# Patient Record
Sex: Female | Born: 1940
Health system: Southern US, Community
[De-identification: ages and names within clinical notes are randomized; demographics above are authoritative.]

## PROBLEM LIST (undated history)

## (undated) DIAGNOSIS — E785 Hyperlipidemia, unspecified: Secondary | ICD-10-CM

## (undated) DIAGNOSIS — R413 Other amnesia: Secondary | ICD-10-CM

## (undated) DIAGNOSIS — E559 Vitamin D deficiency, unspecified: Secondary | ICD-10-CM

## (undated) DIAGNOSIS — R296 Repeated falls: Secondary | ICD-10-CM

## (undated) DIAGNOSIS — T783XXA Angioneurotic edema, initial encounter: Secondary | ICD-10-CM

## (undated) DIAGNOSIS — R251 Tremor, unspecified: Secondary | ICD-10-CM

## (undated) DIAGNOSIS — R001 Bradycardia, unspecified: Secondary | ICD-10-CM

## (undated) DIAGNOSIS — F32A Depression, unspecified: Secondary | ICD-10-CM

## (undated) DIAGNOSIS — I10 Essential (primary) hypertension: Secondary | ICD-10-CM

## (undated) DIAGNOSIS — K649 Unspecified hemorrhoids: Secondary | ICD-10-CM

## (undated) DIAGNOSIS — M8580 Other specified disorders of bone density and structure, unspecified site: Secondary | ICD-10-CM

## (undated) DIAGNOSIS — E039 Hypothyroidism, unspecified: Secondary | ICD-10-CM

## (undated) DIAGNOSIS — K5909 Other constipation: Secondary | ICD-10-CM

## (undated) DIAGNOSIS — F329 Major depressive disorder, single episode, unspecified: Secondary | ICD-10-CM

## (undated) DIAGNOSIS — M542 Cervicalgia: Secondary | ICD-10-CM

## (undated) HISTORY — DX: Hypercalcemia: E83.52

## (undated) HISTORY — DX: Tremor, unspecified: R25.1

## (undated) HISTORY — PX: TUBAL LIGATION: SHX77

## (undated) HISTORY — DX: Hyperlipidemia, unspecified: E78.5

## (undated) HISTORY — DX: Unspecified hemorrhoids: K64.9

## (undated) HISTORY — DX: Depression, unspecified: F32.A

## (undated) HISTORY — DX: Repeated falls: R29.6

## (undated) HISTORY — DX: Bradycardia, unspecified: R00.1

## (undated) HISTORY — DX: Other amnesia: R41.3

## (undated) HISTORY — DX: Essential (primary) hypertension: I10

## (undated) HISTORY — DX: Other specified disorders of bone density and structure, unspecified site: M85.80

## (undated) HISTORY — PX: MINOR HEMORRHOIDECTOMY: SHX6238

## (undated) HISTORY — DX: Hemochromatosis, unspecified: E83.119

## (undated) HISTORY — PX: KNEE SURGERY: SHX244

## (undated) HISTORY — DX: Angioneurotic edema, initial encounter: T78.3XXA

## (undated) HISTORY — DX: Hypothyroidism, unspecified: E03.9

## (undated) HISTORY — DX: Vitamin D deficiency, unspecified: E55.9

## (undated) HISTORY — DX: Hereditary hemochromatosis: E83.110

## (undated) HISTORY — PX: CHOLECYSTECTOMY: SHX55

## (undated) HISTORY — DX: Cervicalgia: M54.2

## (undated) HISTORY — PX: SHOULDER SURGERY: SHX246

## (undated) HISTORY — DX: Major depressive disorder, single episode, unspecified: F32.9

## (undated) HISTORY — DX: Other constipation: K59.09

---

## 1998-09-17 ENCOUNTER — Encounter: Payer: Self-pay | Admitting: *Deleted

## 1998-09-17 ENCOUNTER — Ambulatory Visit (HOSPITAL_COMMUNITY): Admission: RE | Admit: 1998-09-17 | Discharge: 1998-09-18 | Payer: Self-pay | Admitting: *Deleted

## 1998-09-23 ENCOUNTER — Other Ambulatory Visit: Admission: RE | Admit: 1998-09-23 | Discharge: 1998-09-23 | Payer: Self-pay | Admitting: Gynecology

## 1999-09-24 ENCOUNTER — Other Ambulatory Visit: Admission: RE | Admit: 1999-09-24 | Discharge: 1999-09-24 | Payer: Self-pay | Admitting: Gynecology

## 2000-09-26 ENCOUNTER — Other Ambulatory Visit: Admission: RE | Admit: 2000-09-26 | Discharge: 2000-09-26 | Payer: Self-pay | Admitting: Gynecology

## 2001-10-03 ENCOUNTER — Other Ambulatory Visit: Admission: RE | Admit: 2001-10-03 | Discharge: 2001-10-03 | Payer: Self-pay | Admitting: Gynecology

## 2002-10-15 ENCOUNTER — Other Ambulatory Visit: Admission: RE | Admit: 2002-10-15 | Discharge: 2002-10-15 | Payer: Self-pay | Admitting: Gynecology

## 2003-10-21 ENCOUNTER — Other Ambulatory Visit: Admission: RE | Admit: 2003-10-21 | Discharge: 2003-10-21 | Payer: Self-pay | Admitting: Gynecology

## 2004-11-12 ENCOUNTER — Other Ambulatory Visit: Admission: RE | Admit: 2004-11-12 | Discharge: 2004-11-12 | Payer: Self-pay | Admitting: Gynecology

## 2012-02-22 ENCOUNTER — Other Ambulatory Visit: Payer: Self-pay | Admitting: Gynecology

## 2012-02-22 DIAGNOSIS — R928 Other abnormal and inconclusive findings on diagnostic imaging of breast: Secondary | ICD-10-CM

## 2012-02-25 ENCOUNTER — Ambulatory Visit
Admission: RE | Admit: 2012-02-25 | Discharge: 2012-02-25 | Disposition: A | Discharge status: Home / Self Care | Payer: Medicare Other | Source: Ambulatory Visit | Attending: Gynecology | Admitting: Gynecology

## 2012-02-25 DIAGNOSIS — R928 Other abnormal and inconclusive findings on diagnostic imaging of breast: Secondary | ICD-10-CM

## 2013-07-20 ENCOUNTER — Encounter: Payer: Self-pay | Admitting: Cardiovascular Disease

## 2013-07-20 ENCOUNTER — Ambulatory Visit (INDEPENDENT_AMBULATORY_CARE_PROVIDER_SITE_OTHER): Payer: Medicare HMO | Admitting: Cardiovascular Disease

## 2013-07-20 VITALS — BP 130/78 | HR 76 | Ht 62.0 in | Wt 122.0 lb

## 2013-07-20 DIAGNOSIS — R002 Palpitations: Secondary | ICD-10-CM

## 2013-07-20 DIAGNOSIS — E785 Hyperlipidemia, unspecified: Secondary | ICD-10-CM | POA: Insufficient documentation

## 2013-07-20 DIAGNOSIS — I10 Essential (primary) hypertension: Secondary | ICD-10-CM

## 2013-07-20 DIAGNOSIS — R5381 Other malaise: Secondary | ICD-10-CM

## 2013-07-20 DIAGNOSIS — R5383 Other fatigue: Secondary | ICD-10-CM

## 2013-07-20 HISTORY — DX: Palpitations: R00.2

## 2013-07-20 LAB — BASIC METABOLIC PANEL
BUN: 16 mg/dL (ref 6–23)
CO2: 28 mEq/L (ref 19–32)
CREATININE: 0.7 mg/dL (ref 0.4–1.2)
Calcium: 9.8 mg/dL (ref 8.4–10.5)
Chloride: 98 mEq/L (ref 96–112)
GFR: 89.64 mL/min (ref >60.00)
GLUCOSE: 106 mg/dL — AB (ref 70–99)
Potassium: 3.6 mEq/L (ref 3.5–5.1)
Sodium: 134 mEq/L — ABNORMAL LOW (ref 135–145)

## 2013-07-20 NOTE — Patient Instructions (Signed)
Your physician recommends that you schedule a follow-up appointment in: 6-8 weeks  Your physician has requested that you have an echocardiogram. Echocardiography is a painless test that uses sound waves to create images of your heart. It provides your doctor with information about the size and shape of your heart and how well your heart's chambers and valves are working. This procedure takes approximately one hour. There are no restrictions for this procedure.   Your physician has recommended that you wear an event monitor. Event monitors are medical devices that record the heart's electrical activity. Doctors most often us these monitors to diagnose arrhythmias. Arrhythmias are problems with the speed or rhythm of the heartbeat. The monitor is a small, portable device. You can wear one while you do your normal daily activities. This is usually used to diagnose what is causing palpitations/syncope (passing out).    
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
## 2013-07-23 ENCOUNTER — Telehealth: Payer: Self-pay | Admitting: Cardiovascular Disease

## 2013-07-23 NOTE — Telephone Encounter (Signed)
Daughter aware of results.

## 2013-07-23 NOTE — Telephone Encounter (Signed)
New message  Daughter would like to know the results of blood work, please call and advise.

## 2013-07-25 ENCOUNTER — Encounter: Payer: Self-pay | Admitting: *Deleted

## 2013-07-25 ENCOUNTER — Encounter (INDEPENDENT_AMBULATORY_CARE_PROVIDER_SITE_OTHER): Payer: Medicare HMO

## 2013-07-25 DIAGNOSIS — R002 Palpitations: Secondary | ICD-10-CM

## 2013-07-25 NOTE — Progress Notes (Signed)
Patient ID: Michele Parker, female   DOB: 03/16/1941, 73 y.o.   MRN: [id number] E-Cardio verite 30 day cardiac event monitor applied to patient.

## 2013-07-31 ENCOUNTER — Ambulatory Visit (HOSPITAL_COMMUNITY)
Admission: RE | Admit: 2013-07-31 | Discharge: 2013-07-31 | Disposition: A | Discharge status: Home / Self Care | Payer: Medicare HMO | Source: Ambulatory Visit | Attending: Cardiovascular Disease | Admitting: Cardiovascular Disease

## 2013-07-31 DIAGNOSIS — R5383 Other fatigue: Secondary | ICD-10-CM

## 2013-07-31 DIAGNOSIS — R5381 Other malaise: Secondary | ICD-10-CM | POA: Insufficient documentation

## 2013-07-31 DIAGNOSIS — I379 Nonrheumatic pulmonary valve disorder, unspecified: Secondary | ICD-10-CM

## 2013-07-31 NOTE — Progress Notes (Signed)
2D Echo Performed 07/31/2013    Marygrace Drought, RCS

## 2013-08-06 ENCOUNTER — Telehealth: Payer: Self-pay | Admitting: Cardiovascular Disease

## 2013-08-06 NOTE — Telephone Encounter (Signed)
Thanks, Michele Parker 

## 2013-08-06 NOTE — Telephone Encounter (Signed)
New message  Patient is having a lot of problems with depression. The monitor that she is wearing a worry for her right. Daughter wants to make sure it is okay to take the monitor off. Please call and advise.

## 2013-08-06 NOTE — Telephone Encounter (Signed)
Spoke with pt's daughter who reports pt is having great deal of anxiety related to monitor. Daughter reports pt has multiple concerns about monitor throughout the day. Daughter reports she is concerned her mother is having a nervous breakdown. She is going to take pt to see primary care provider or to [facility] today for evaluation.  I told daughter ideally we would like pt to wear monitor for entire time but since she is having so many issues she can take monitor off. Daughter will mail monitor in today

## 2013-08-06 NOTE — Telephone Encounter (Signed)
Available monitor strips reviewed. No events recorded other than baseline reading which is SR.

## 2013-08-31 ENCOUNTER — Ambulatory Visit: Payer: Medicare HMO | Admitting: Cardiovascular Disease

## 2013-09-06 DIAGNOSIS — F29 Unspecified psychosis not due to a substance or known physiological condition: Secondary | ICD-10-CM | POA: Insufficient documentation

## 2013-09-27 DIAGNOSIS — F3341 Major depressive disorder, recurrent, in partial remission: Secondary | ICD-10-CM

## 2013-09-27 DIAGNOSIS — F0390 Unspecified dementia without behavioral disturbance: Secondary | ICD-10-CM | POA: Insufficient documentation

## 2013-09-27 DIAGNOSIS — F03B0 Unspecified dementia, moderate, without behavioral disturbance, psychotic disturbance, mood disturbance, and anxiety: Secondary | ICD-10-CM | POA: Insufficient documentation

## 2013-09-27 DIAGNOSIS — R4189 Other symptoms and signs involving cognitive functions and awareness: Secondary | ICD-10-CM | POA: Insufficient documentation

## 2013-09-27 DIAGNOSIS — F323 Major depressive disorder, single episode, severe with psychotic features: Secondary | ICD-10-CM | POA: Insufficient documentation

## 2013-09-27 HISTORY — DX: Major depressive disorder, recurrent, in partial remission: F33.41

## 2016-06-09 DIAGNOSIS — M1990 Unspecified osteoarthritis, unspecified site: Secondary | ICD-10-CM

## 2016-06-09 HISTORY — DX: Unspecified osteoarthritis, unspecified site: M19.90

## 2016-11-18 DIAGNOSIS — Z0000 Encounter for general adult medical examination without abnormal findings: Secondary | ICD-10-CM | POA: Diagnosis not present

## 2016-11-18 DIAGNOSIS — E1169 Type 2 diabetes mellitus with other specified complication: Secondary | ICD-10-CM | POA: Diagnosis not present

## 2016-11-18 DIAGNOSIS — Z1389 Encounter for screening for other disorder: Secondary | ICD-10-CM | POA: Diagnosis not present

## 2016-11-18 DIAGNOSIS — E785 Hyperlipidemia, unspecified: Secondary | ICD-10-CM | POA: Diagnosis not present

## 2016-11-18 DIAGNOSIS — I10 Essential (primary) hypertension: Secondary | ICD-10-CM | POA: Diagnosis not present

## 2016-11-18 DIAGNOSIS — E039 Hypothyroidism, unspecified: Secondary | ICD-10-CM | POA: Diagnosis not present

## 2016-11-18 DIAGNOSIS — Z79899 Other long term (current) drug therapy: Secondary | ICD-10-CM | POA: Diagnosis not present

## 2016-11-25 DIAGNOSIS — Z9889 Other specified postprocedural states: Secondary | ICD-10-CM | POA: Diagnosis not present

## 2016-11-25 DIAGNOSIS — F323 Major depressive disorder, single episode, severe with psychotic features: Secondary | ICD-10-CM | POA: Diagnosis not present

## 2016-11-25 DIAGNOSIS — R4189 Other symptoms and signs involving cognitive functions and awareness: Secondary | ICD-10-CM | POA: Diagnosis not present

## 2016-11-25 DIAGNOSIS — I10 Essential (primary) hypertension: Secondary | ICD-10-CM | POA: Diagnosis not present

## 2016-11-26 DIAGNOSIS — N390 Urinary tract infection, site not specified: Secondary | ICD-10-CM | POA: Diagnosis not present

## 2016-12-22 DIAGNOSIS — R4189 Other symptoms and signs involving cognitive functions and awareness: Secondary | ICD-10-CM | POA: Diagnosis not present

## 2016-12-22 DIAGNOSIS — F323 Major depressive disorder, single episode, severe with psychotic features: Secondary | ICD-10-CM | POA: Diagnosis not present

## 2016-12-22 DIAGNOSIS — I10 Essential (primary) hypertension: Secondary | ICD-10-CM | POA: Diagnosis not present

## 2016-12-22 DIAGNOSIS — Z9889 Other specified postprocedural states: Secondary | ICD-10-CM | POA: Diagnosis not present

## 2016-12-22 DIAGNOSIS — F4322 Adjustment disorder with anxiety: Secondary | ICD-10-CM | POA: Diagnosis not present

## 2017-01-26 DIAGNOSIS — R251 Tremor, unspecified: Secondary | ICD-10-CM | POA: Diagnosis not present

## 2017-01-26 DIAGNOSIS — R7303 Prediabetes: Secondary | ICD-10-CM | POA: Diagnosis not present

## 2017-01-26 DIAGNOSIS — I10 Essential (primary) hypertension: Secondary | ICD-10-CM | POA: Diagnosis not present

## 2017-01-26 DIAGNOSIS — E785 Hyperlipidemia, unspecified: Secondary | ICD-10-CM | POA: Diagnosis not present

## 2017-01-26 DIAGNOSIS — E039 Hypothyroidism, unspecified: Secondary | ICD-10-CM | POA: Diagnosis not present

## 2017-01-26 DIAGNOSIS — F329 Major depressive disorder, single episode, unspecified: Secondary | ICD-10-CM | POA: Diagnosis not present

## 2017-02-02 DIAGNOSIS — H524 Presbyopia: Secondary | ICD-10-CM | POA: Diagnosis not present

## 2017-02-03 DIAGNOSIS — R4189 Other symptoms and signs involving cognitive functions and awareness: Secondary | ICD-10-CM | POA: Diagnosis not present

## 2017-02-03 DIAGNOSIS — Z9889 Other specified postprocedural states: Secondary | ICD-10-CM | POA: Diagnosis not present

## 2017-02-03 DIAGNOSIS — I10 Essential (primary) hypertension: Secondary | ICD-10-CM | POA: Diagnosis not present

## 2017-02-03 DIAGNOSIS — F323 Major depressive disorder, single episode, severe with psychotic features: Secondary | ICD-10-CM | POA: Diagnosis not present

## 2017-05-05 DIAGNOSIS — F3341 Major depressive disorder, recurrent, in partial remission: Secondary | ICD-10-CM | POA: Diagnosis not present

## 2017-05-05 DIAGNOSIS — I10 Essential (primary) hypertension: Secondary | ICD-10-CM | POA: Diagnosis not present

## 2017-05-05 DIAGNOSIS — Z9889 Other specified postprocedural states: Secondary | ICD-10-CM | POA: Diagnosis not present

## 2017-05-05 DIAGNOSIS — R4189 Other symptoms and signs involving cognitive functions and awareness: Secondary | ICD-10-CM | POA: Diagnosis not present

## 2017-06-07 DIAGNOSIS — Z1331 Encounter for screening for depression: Secondary | ICD-10-CM | POA: Diagnosis not present

## 2017-06-07 DIAGNOSIS — R7303 Prediabetes: Secondary | ICD-10-CM | POA: Diagnosis not present

## 2017-06-07 DIAGNOSIS — Z79899 Other long term (current) drug therapy: Secondary | ICD-10-CM | POA: Diagnosis not present

## 2017-06-07 DIAGNOSIS — I10 Essential (primary) hypertension: Secondary | ICD-10-CM | POA: Diagnosis not present

## 2017-06-07 DIAGNOSIS — E039 Hypothyroidism, unspecified: Secondary | ICD-10-CM | POA: Diagnosis not present

## 2017-06-07 DIAGNOSIS — Z0000 Encounter for general adult medical examination without abnormal findings: Secondary | ICD-10-CM | POA: Diagnosis not present

## 2017-06-07 DIAGNOSIS — E559 Vitamin D deficiency, unspecified: Secondary | ICD-10-CM | POA: Diagnosis not present

## 2017-06-07 DIAGNOSIS — E785 Hyperlipidemia, unspecified: Secondary | ICD-10-CM | POA: Diagnosis not present

## 2017-06-07 DIAGNOSIS — Z1211 Encounter for screening for malignant neoplasm of colon: Secondary | ICD-10-CM | POA: Diagnosis not present

## 2017-06-07 DIAGNOSIS — Z1231 Encounter for screening mammogram for malignant neoplasm of breast: Secondary | ICD-10-CM | POA: Diagnosis not present

## 2017-06-29 DIAGNOSIS — R7303 Prediabetes: Secondary | ICD-10-CM | POA: Diagnosis not present

## 2017-06-29 DIAGNOSIS — R05 Cough: Secondary | ICD-10-CM | POA: Diagnosis not present

## 2017-06-29 DIAGNOSIS — R251 Tremor, unspecified: Secondary | ICD-10-CM | POA: Diagnosis not present

## 2017-06-29 DIAGNOSIS — I10 Essential (primary) hypertension: Secondary | ICD-10-CM | POA: Diagnosis not present

## 2017-06-29 DIAGNOSIS — E785 Hyperlipidemia, unspecified: Secondary | ICD-10-CM | POA: Diagnosis not present

## 2017-06-29 DIAGNOSIS — F329 Major depressive disorder, single episode, unspecified: Secondary | ICD-10-CM | POA: Diagnosis not present

## 2017-06-29 DIAGNOSIS — E039 Hypothyroidism, unspecified: Secondary | ICD-10-CM | POA: Diagnosis not present

## 2017-07-18 DIAGNOSIS — Z1231 Encounter for screening mammogram for malignant neoplasm of breast: Secondary | ICD-10-CM | POA: Diagnosis not present

## 2017-08-02 ENCOUNTER — Ambulatory Visit: Payer: Medicare HMO | Admitting: Neurology

## 2017-08-16 DIAGNOSIS — J22 Unspecified acute lower respiratory infection: Secondary | ICD-10-CM | POA: Diagnosis not present

## 2017-09-13 DIAGNOSIS — F3341 Major depressive disorder, recurrent, in partial remission: Secondary | ICD-10-CM | POA: Diagnosis not present

## 2017-09-13 DIAGNOSIS — F29 Unspecified psychosis not due to a substance or known physiological condition: Secondary | ICD-10-CM | POA: Diagnosis not present

## 2017-09-13 DIAGNOSIS — R251 Tremor, unspecified: Secondary | ICD-10-CM | POA: Diagnosis not present

## 2017-09-13 DIAGNOSIS — R4189 Other symptoms and signs involving cognitive functions and awareness: Secondary | ICD-10-CM | POA: Diagnosis not present

## 2017-09-13 DIAGNOSIS — I10 Essential (primary) hypertension: Secondary | ICD-10-CM | POA: Diagnosis not present

## 2017-09-13 DIAGNOSIS — Z9889 Other specified postprocedural states: Secondary | ICD-10-CM | POA: Diagnosis not present

## 2017-09-27 ENCOUNTER — Telehealth: Payer: Self-pay | Admitting: Neurology

## 2017-09-27 ENCOUNTER — Ambulatory Visit: Payer: Medicare HMO | Admitting: Neurology

## 2017-09-27 ENCOUNTER — Encounter: Payer: Self-pay | Admitting: Neurology

## 2017-09-27 VITALS — BP 138/82 | HR 82 | Ht 62.0 in | Wt 127.8 lb

## 2017-09-27 DIAGNOSIS — R269 Unspecified abnormalities of gait and mobility: Secondary | ICD-10-CM

## 2017-09-27 DIAGNOSIS — F09 Unspecified mental disorder due to known physiological condition: Secondary | ICD-10-CM | POA: Insufficient documentation

## 2017-09-27 DIAGNOSIS — R413 Other amnesia: Secondary | ICD-10-CM | POA: Diagnosis not present

## 2017-09-27 DIAGNOSIS — R251 Tremor, unspecified: Secondary | ICD-10-CM | POA: Diagnosis not present

## 2017-09-27 HISTORY — DX: Unspecified abnormalities of gait and mobility: R26.9
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
## 2017-09-27 NOTE — Telephone Encounter (Signed)
Michele Parker: [id number] (exp. 09/27/17 to 10/27/17) order sent to GI. They will reach out to the pt to schedule.

## 2017-10-06 ENCOUNTER — Ambulatory Visit
Admission: RE | Admit: 2017-10-06 | Discharge: 2017-10-06 | Disposition: A | Discharge status: Home / Self Care | Payer: Medicare HMO | Source: Ambulatory Visit | Attending: Neurology | Admitting: Neurology

## 2017-10-06 DIAGNOSIS — F09 Unspecified mental disorder due to known physiological condition: Secondary | ICD-10-CM | POA: Diagnosis not present

## 2017-10-06 DIAGNOSIS — R413 Other amnesia: Secondary | ICD-10-CM

## 2017-10-06 DIAGNOSIS — R251 Tremor, unspecified: Secondary | ICD-10-CM | POA: Diagnosis not present

## 2017-10-06 DIAGNOSIS — R269 Unspecified abnormalities of gait and mobility: Secondary | ICD-10-CM | POA: Diagnosis not present

## 2017-10-07 ENCOUNTER — Telehealth: Payer: Self-pay | Admitting: Neurology

## 2017-10-07 NOTE — Telephone Encounter (Signed)
Please call patient, MRI of brain showed evidence of generalized atrophy, mild small vessel disease, chronic  changes I will review films with her at next follow-up visit.  IMPRESSION: Slightly abnormal MRI scan of the brain showing mild age disproportionate cortical atrophy and changes of chronic microvascular ischemia.

## 2017-10-07 NOTE — Telephone Encounter (Signed)
LMOM for [name] with below MRI report and advised YY will review in greater detail at next ov; please call if they have any questions before then/fim

## 2017-10-14 DIAGNOSIS — F09 Unspecified mental disorder due to known physiological condition: Secondary | ICD-10-CM | POA: Diagnosis not present

## 2017-10-14 DIAGNOSIS — R251 Tremor, unspecified: Secondary | ICD-10-CM | POA: Diagnosis not present

## 2017-10-14 DIAGNOSIS — R413 Other amnesia: Secondary | ICD-10-CM | POA: Diagnosis not present

## 2017-10-14 DIAGNOSIS — R269 Unspecified abnormalities of gait and mobility: Secondary | ICD-10-CM | POA: Diagnosis not present

## 2017-10-18 DIAGNOSIS — E782 Mixed hyperlipidemia: Secondary | ICD-10-CM | POA: Diagnosis not present

## 2017-10-18 DIAGNOSIS — F331 Major depressive disorder, recurrent, moderate: Secondary | ICD-10-CM | POA: Diagnosis not present

## 2017-10-18 DIAGNOSIS — I10 Essential (primary) hypertension: Secondary | ICD-10-CM | POA: Diagnosis not present

## 2017-10-18 DIAGNOSIS — R413 Other amnesia: Secondary | ICD-10-CM | POA: Diagnosis not present

## 2017-10-18 DIAGNOSIS — Z6823 Body mass index (BMI) 23.0-23.9, adult: Secondary | ICD-10-CM | POA: Diagnosis not present

## 2017-10-18 DIAGNOSIS — R7301 Impaired fasting glucose: Secondary | ICD-10-CM | POA: Diagnosis not present

## 2017-10-18 DIAGNOSIS — E038 Other specified hypothyroidism: Secondary | ICD-10-CM | POA: Diagnosis not present

## 2017-10-19 DIAGNOSIS — F3341 Major depressive disorder, recurrent, in partial remission: Secondary | ICD-10-CM | POA: Diagnosis not present

## 2017-10-19 DIAGNOSIS — R4189 Other symptoms and signs involving cognitive functions and awareness: Secondary | ICD-10-CM | POA: Diagnosis not present

## 2017-10-19 DIAGNOSIS — Z9889 Other specified postprocedural states: Secondary | ICD-10-CM | POA: Diagnosis not present

## 2017-10-19 DIAGNOSIS — R251 Tremor, unspecified: Secondary | ICD-10-CM | POA: Diagnosis not present

## 2017-10-19 DIAGNOSIS — F29 Unspecified psychosis not due to a substance or known physiological condition: Secondary | ICD-10-CM | POA: Diagnosis not present

## 2017-11-03 DIAGNOSIS — R251 Tremor, unspecified: Secondary | ICD-10-CM | POA: Diagnosis not present

## 2017-11-03 DIAGNOSIS — R413 Other amnesia: Secondary | ICD-10-CM | POA: Diagnosis not present

## 2017-11-03 DIAGNOSIS — F09 Unspecified mental disorder due to known physiological condition: Secondary | ICD-10-CM | POA: Diagnosis not present

## 2017-11-03 DIAGNOSIS — R269 Unspecified abnormalities of gait and mobility: Secondary | ICD-10-CM | POA: Diagnosis not present

## 2017-11-24 DIAGNOSIS — I10 Essential (primary) hypertension: Secondary | ICD-10-CM | POA: Diagnosis not present

## 2017-11-24 DIAGNOSIS — R413 Other amnesia: Secondary | ICD-10-CM | POA: Diagnosis not present

## 2017-11-24 DIAGNOSIS — F331 Major depressive disorder, recurrent, moderate: Secondary | ICD-10-CM | POA: Diagnosis not present

## 2017-11-24 DIAGNOSIS — Z6823 Body mass index (BMI) 23.0-23.9, adult: Secondary | ICD-10-CM | POA: Diagnosis not present

## 2017-12-22 DIAGNOSIS — R21 Rash and other nonspecific skin eruption: Secondary | ICD-10-CM | POA: Diagnosis not present

## 2017-12-22 DIAGNOSIS — I10 Essential (primary) hypertension: Secondary | ICD-10-CM | POA: Diagnosis not present

## 2017-12-22 DIAGNOSIS — E038 Other specified hypothyroidism: Secondary | ICD-10-CM | POA: Diagnosis not present

## 2017-12-22 DIAGNOSIS — Z6823 Body mass index (BMI) 23.0-23.9, adult: Secondary | ICD-10-CM | POA: Diagnosis not present

## 2017-12-22 DIAGNOSIS — F330 Major depressive disorder, recurrent, mild: Secondary | ICD-10-CM | POA: Diagnosis not present

## 2017-12-22 DIAGNOSIS — E782 Mixed hyperlipidemia: Secondary | ICD-10-CM | POA: Diagnosis not present

## 2017-12-22 DIAGNOSIS — R413 Other amnesia: Secondary | ICD-10-CM | POA: Diagnosis not present

## 2017-12-22 DIAGNOSIS — K5909 Other constipation: Secondary | ICD-10-CM | POA: Diagnosis not present

## 2017-12-27 ENCOUNTER — Encounter: Payer: Self-pay | Admitting: Neurology

## 2017-12-27 ENCOUNTER — Ambulatory Visit: Payer: Medicare HMO | Admitting: Neurology

## 2017-12-27 VITALS — BP 145/84 | HR 87 | Ht 62.0 in | Wt 126.5 lb

## 2017-12-27 DIAGNOSIS — R251 Tremor, unspecified: Secondary | ICD-10-CM | POA: Diagnosis not present

## 2017-12-27 DIAGNOSIS — R269 Unspecified abnormalities of gait and mobility: Secondary | ICD-10-CM | POA: Diagnosis not present

## 2017-12-27 DIAGNOSIS — R413 Other amnesia: Secondary | ICD-10-CM | POA: Diagnosis not present

## 2017-12-27 DIAGNOSIS — F09 Unspecified mental disorder due to known physiological condition: Secondary | ICD-10-CM
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
## 2018-01-19 DIAGNOSIS — Z9889 Other specified postprocedural states: Secondary | ICD-10-CM | POA: Diagnosis not present

## 2018-01-19 DIAGNOSIS — R4189 Other symptoms and signs involving cognitive functions and awareness: Secondary | ICD-10-CM | POA: Diagnosis not present

## 2018-01-19 DIAGNOSIS — Z79899 Other long term (current) drug therapy: Secondary | ICD-10-CM | POA: Diagnosis not present

## 2018-01-19 DIAGNOSIS — F411 Generalized anxiety disorder: Secondary | ICD-10-CM | POA: Diagnosis not present

## 2018-01-19 DIAGNOSIS — R600 Localized edema: Secondary | ICD-10-CM | POA: Diagnosis not present

## 2018-01-19 DIAGNOSIS — F3341 Major depressive disorder, recurrent, in partial remission: Secondary | ICD-10-CM | POA: Diagnosis not present

## 2018-03-29 DIAGNOSIS — I10 Essential (primary) hypertension: Secondary | ICD-10-CM | POA: Diagnosis not present

## 2018-03-29 DIAGNOSIS — E038 Other specified hypothyroidism: Secondary | ICD-10-CM | POA: Diagnosis not present

## 2018-03-29 DIAGNOSIS — F330 Major depressive disorder, recurrent, mild: Secondary | ICD-10-CM | POA: Diagnosis not present

## 2018-03-29 DIAGNOSIS — E782 Mixed hyperlipidemia: Secondary | ICD-10-CM | POA: Diagnosis not present

## 2018-04-03 DIAGNOSIS — S5292XA Unspecified fracture of left forearm, initial encounter for closed fracture: Secondary | ICD-10-CM | POA: Diagnosis not present

## 2018-04-03 DIAGNOSIS — S6992XA Unspecified injury of left wrist, hand and finger(s), initial encounter: Secondary | ICD-10-CM | POA: Diagnosis not present

## 2018-04-05 DIAGNOSIS — S52532A Colles' fracture of left radius, initial encounter for closed fracture: Secondary | ICD-10-CM | POA: Diagnosis not present

## 2018-04-12 DIAGNOSIS — I10 Essential (primary) hypertension: Secondary | ICD-10-CM | POA: Diagnosis not present

## 2018-04-12 DIAGNOSIS — Z23 Encounter for immunization: Secondary | ICD-10-CM | POA: Diagnosis not present

## 2018-04-14 DIAGNOSIS — S52532A Colles' fracture of left radius, initial encounter for closed fracture: Secondary | ICD-10-CM | POA: Diagnosis not present

## 2018-04-18 DIAGNOSIS — Z9889 Other specified postprocedural states: Secondary | ICD-10-CM | POA: Diagnosis not present

## 2018-04-18 DIAGNOSIS — F3341 Major depressive disorder, recurrent, in partial remission: Secondary | ICD-10-CM | POA: Diagnosis not present

## 2018-04-18 DIAGNOSIS — F29 Unspecified psychosis not due to a substance or known physiological condition: Secondary | ICD-10-CM | POA: Diagnosis not present

## 2018-04-18 DIAGNOSIS — F411 Generalized anxiety disorder: Secondary | ICD-10-CM | POA: Diagnosis not present

## 2018-04-18 DIAGNOSIS — R4189 Other symptoms and signs involving cognitive functions and awareness: Secondary | ICD-10-CM | POA: Diagnosis not present

## 2018-04-21 DIAGNOSIS — S52532D Colles' fracture of left radius, subsequent encounter for closed fracture with routine healing: Secondary | ICD-10-CM | POA: Diagnosis not present

## 2018-05-08 DIAGNOSIS — I10 Essential (primary) hypertension: Secondary | ICD-10-CM | POA: Diagnosis not present

## 2018-05-08 DIAGNOSIS — E2839 Other primary ovarian failure: Secondary | ICD-10-CM | POA: Diagnosis not present

## 2018-05-08 DIAGNOSIS — M8589 Other specified disorders of bone density and structure, multiple sites: Secondary | ICD-10-CM | POA: Diagnosis not present

## 2018-05-08 DIAGNOSIS — E038 Other specified hypothyroidism: Secondary | ICD-10-CM | POA: Diagnosis not present

## 2018-05-19 DIAGNOSIS — S52532D Colles' fracture of left radius, subsequent encounter for closed fracture with routine healing: Secondary | ICD-10-CM | POA: Diagnosis not present

## 2018-06-05 DIAGNOSIS — I10 Essential (primary) hypertension: Secondary | ICD-10-CM | POA: Diagnosis not present

## 2018-06-05 DIAGNOSIS — E038 Other specified hypothyroidism: Secondary | ICD-10-CM | POA: Diagnosis not present

## 2018-06-16 DIAGNOSIS — S52532D Colles' fracture of left radius, subsequent encounter for closed fracture with routine healing: Secondary | ICD-10-CM | POA: Diagnosis not present

## 2018-07-12 DIAGNOSIS — Z6825 Body mass index (BMI) 25.0-25.9, adult: Secondary | ICD-10-CM | POA: Diagnosis not present

## 2018-07-12 DIAGNOSIS — I10 Essential (primary) hypertension: Secondary | ICD-10-CM | POA: Diagnosis not present

## 2018-07-12 DIAGNOSIS — E038 Other specified hypothyroidism: Secondary | ICD-10-CM | POA: Diagnosis not present

## 2018-07-12 DIAGNOSIS — Z0001 Encounter for general adult medical examination with abnormal findings: Secondary | ICD-10-CM | POA: Diagnosis not present

## 2018-07-12 DIAGNOSIS — R5383 Other fatigue: Secondary | ICD-10-CM | POA: Diagnosis not present

## 2018-07-12 DIAGNOSIS — R296 Repeated falls: Secondary | ICD-10-CM | POA: Diagnosis not present

## 2018-08-01 DIAGNOSIS — E038 Other specified hypothyroidism: Secondary | ICD-10-CM | POA: Diagnosis not present

## 2018-08-01 DIAGNOSIS — Z9181 History of falling: Secondary | ICD-10-CM | POA: Diagnosis not present

## 2018-08-01 DIAGNOSIS — K5909 Other constipation: Secondary | ICD-10-CM | POA: Diagnosis not present

## 2018-08-01 DIAGNOSIS — E782 Mixed hyperlipidemia: Secondary | ICD-10-CM | POA: Diagnosis not present

## 2018-08-01 DIAGNOSIS — R296 Repeated falls: Secondary | ICD-10-CM | POA: Diagnosis not present

## 2018-08-01 DIAGNOSIS — Z9049 Acquired absence of other specified parts of digestive tract: Secondary | ICD-10-CM | POA: Diagnosis not present

## 2018-08-01 DIAGNOSIS — I10 Essential (primary) hypertension: Secondary | ICD-10-CM | POA: Diagnosis not present

## 2018-08-01 DIAGNOSIS — M1991 Primary osteoarthritis, unspecified site: Secondary | ICD-10-CM | POA: Diagnosis not present

## 2018-08-01 DIAGNOSIS — F339 Major depressive disorder, recurrent, unspecified: Secondary | ICD-10-CM | POA: Diagnosis not present

## 2018-08-04 DIAGNOSIS — Z9181 History of falling: Secondary | ICD-10-CM | POA: Diagnosis not present

## 2018-08-04 DIAGNOSIS — E038 Other specified hypothyroidism: Secondary | ICD-10-CM | POA: Diagnosis not present

## 2018-08-04 DIAGNOSIS — K5909 Other constipation: Secondary | ICD-10-CM | POA: Diagnosis not present

## 2018-08-04 DIAGNOSIS — F339 Major depressive disorder, recurrent, unspecified: Secondary | ICD-10-CM | POA: Diagnosis not present

## 2018-08-04 DIAGNOSIS — I10 Essential (primary) hypertension: Secondary | ICD-10-CM | POA: Diagnosis not present

## 2018-08-04 DIAGNOSIS — Z9049 Acquired absence of other specified parts of digestive tract: Secondary | ICD-10-CM | POA: Diagnosis not present

## 2018-08-04 DIAGNOSIS — M1991 Primary osteoarthritis, unspecified site: Secondary | ICD-10-CM | POA: Diagnosis not present

## 2018-08-04 DIAGNOSIS — E782 Mixed hyperlipidemia: Secondary | ICD-10-CM | POA: Diagnosis not present

## 2018-08-04 DIAGNOSIS — R296 Repeated falls: Secondary | ICD-10-CM | POA: Diagnosis not present

## 2018-08-07 DIAGNOSIS — R296 Repeated falls: Secondary | ICD-10-CM | POA: Diagnosis not present

## 2018-08-07 DIAGNOSIS — L650 Telogen effluvium: Secondary | ICD-10-CM | POA: Diagnosis not present

## 2018-08-07 DIAGNOSIS — I10 Essential (primary) hypertension: Secondary | ICD-10-CM | POA: Diagnosis not present

## 2018-08-07 DIAGNOSIS — E038 Other specified hypothyroidism: Secondary | ICD-10-CM | POA: Diagnosis not present

## 2018-08-07 DIAGNOSIS — R001 Bradycardia, unspecified: Secondary | ICD-10-CM | POA: Diagnosis not present

## 2018-08-09 DIAGNOSIS — M1991 Primary osteoarthritis, unspecified site: Secondary | ICD-10-CM | POA: Diagnosis not present

## 2018-08-09 DIAGNOSIS — R296 Repeated falls: Secondary | ICD-10-CM | POA: Diagnosis not present

## 2018-08-09 DIAGNOSIS — E038 Other specified hypothyroidism: Secondary | ICD-10-CM | POA: Diagnosis not present

## 2018-08-09 DIAGNOSIS — E782 Mixed hyperlipidemia: Secondary | ICD-10-CM | POA: Diagnosis not present

## 2018-08-09 DIAGNOSIS — K5909 Other constipation: Secondary | ICD-10-CM | POA: Diagnosis not present

## 2018-08-09 DIAGNOSIS — F339 Major depressive disorder, recurrent, unspecified: Secondary | ICD-10-CM | POA: Diagnosis not present

## 2018-08-09 DIAGNOSIS — Z9181 History of falling: Secondary | ICD-10-CM | POA: Diagnosis not present

## 2018-08-09 DIAGNOSIS — Z9049 Acquired absence of other specified parts of digestive tract: Secondary | ICD-10-CM | POA: Diagnosis not present

## 2018-08-09 DIAGNOSIS — I10 Essential (primary) hypertension: Secondary | ICD-10-CM | POA: Diagnosis not present

## 2018-08-10 DIAGNOSIS — R4189 Other symptoms and signs involving cognitive functions and awareness: Secondary | ICD-10-CM | POA: Diagnosis not present

## 2018-08-10 DIAGNOSIS — F411 Generalized anxiety disorder: Secondary | ICD-10-CM | POA: Diagnosis not present

## 2018-08-10 DIAGNOSIS — F3341 Major depressive disorder, recurrent, in partial remission: Secondary | ICD-10-CM | POA: Diagnosis not present

## 2018-08-10 DIAGNOSIS — Z9889 Other specified postprocedural states: Secondary | ICD-10-CM | POA: Diagnosis not present

## 2018-08-11 DIAGNOSIS — M1991 Primary osteoarthritis, unspecified site: Secondary | ICD-10-CM | POA: Diagnosis not present

## 2018-08-11 DIAGNOSIS — I10 Essential (primary) hypertension: Secondary | ICD-10-CM | POA: Diagnosis not present

## 2018-08-11 DIAGNOSIS — R296 Repeated falls: Secondary | ICD-10-CM | POA: Diagnosis not present

## 2018-08-11 DIAGNOSIS — Z9049 Acquired absence of other specified parts of digestive tract: Secondary | ICD-10-CM | POA: Diagnosis not present

## 2018-08-11 DIAGNOSIS — K5909 Other constipation: Secondary | ICD-10-CM | POA: Diagnosis not present

## 2018-08-11 DIAGNOSIS — F339 Major depressive disorder, recurrent, unspecified: Secondary | ICD-10-CM | POA: Diagnosis not present

## 2018-08-11 DIAGNOSIS — Z9181 History of falling: Secondary | ICD-10-CM | POA: Diagnosis not present

## 2018-08-11 DIAGNOSIS — E038 Other specified hypothyroidism: Secondary | ICD-10-CM | POA: Diagnosis not present

## 2018-08-11 DIAGNOSIS — E782 Mixed hyperlipidemia: Secondary | ICD-10-CM | POA: Diagnosis not present

## 2018-08-13 DIAGNOSIS — E038 Other specified hypothyroidism: Secondary | ICD-10-CM | POA: Diagnosis not present

## 2018-08-13 DIAGNOSIS — R296 Repeated falls: Secondary | ICD-10-CM | POA: Diagnosis not present

## 2018-08-13 DIAGNOSIS — I10 Essential (primary) hypertension: Secondary | ICD-10-CM | POA: Diagnosis not present

## 2018-08-13 DIAGNOSIS — E782 Mixed hyperlipidemia: Secondary | ICD-10-CM | POA: Diagnosis not present

## 2018-08-13 DIAGNOSIS — M1991 Primary osteoarthritis, unspecified site: Secondary | ICD-10-CM | POA: Diagnosis not present

## 2018-08-15 DIAGNOSIS — M1991 Primary osteoarthritis, unspecified site: Secondary | ICD-10-CM | POA: Diagnosis not present

## 2018-08-15 DIAGNOSIS — E782 Mixed hyperlipidemia: Secondary | ICD-10-CM | POA: Diagnosis not present

## 2018-08-15 DIAGNOSIS — Z9181 History of falling: Secondary | ICD-10-CM | POA: Diagnosis not present

## 2018-08-15 DIAGNOSIS — I10 Essential (primary) hypertension: Secondary | ICD-10-CM | POA: Diagnosis not present

## 2018-08-15 DIAGNOSIS — R296 Repeated falls: Secondary | ICD-10-CM | POA: Diagnosis not present

## 2018-08-15 DIAGNOSIS — E038 Other specified hypothyroidism: Secondary | ICD-10-CM | POA: Diagnosis not present

## 2018-08-15 DIAGNOSIS — K5909 Other constipation: Secondary | ICD-10-CM | POA: Diagnosis not present

## 2018-08-15 DIAGNOSIS — F339 Major depressive disorder, recurrent, unspecified: Secondary | ICD-10-CM | POA: Diagnosis not present

## 2018-08-15 DIAGNOSIS — Z9049 Acquired absence of other specified parts of digestive tract: Secondary | ICD-10-CM | POA: Diagnosis not present

## 2018-08-17 DIAGNOSIS — R296 Repeated falls: Secondary | ICD-10-CM | POA: Diagnosis not present

## 2018-08-17 DIAGNOSIS — K5909 Other constipation: Secondary | ICD-10-CM | POA: Diagnosis not present

## 2018-08-17 DIAGNOSIS — Z9181 History of falling: Secondary | ICD-10-CM | POA: Diagnosis not present

## 2018-08-17 DIAGNOSIS — Z9049 Acquired absence of other specified parts of digestive tract: Secondary | ICD-10-CM | POA: Diagnosis not present

## 2018-08-17 DIAGNOSIS — I10 Essential (primary) hypertension: Secondary | ICD-10-CM | POA: Diagnosis not present

## 2018-08-17 DIAGNOSIS — F339 Major depressive disorder, recurrent, unspecified: Secondary | ICD-10-CM | POA: Diagnosis not present

## 2018-08-17 DIAGNOSIS — E782 Mixed hyperlipidemia: Secondary | ICD-10-CM | POA: Diagnosis not present

## 2018-08-17 DIAGNOSIS — M1991 Primary osteoarthritis, unspecified site: Secondary | ICD-10-CM | POA: Diagnosis not present

## 2018-08-17 DIAGNOSIS — E038 Other specified hypothyroidism: Secondary | ICD-10-CM | POA: Diagnosis not present

## 2018-09-12 DIAGNOSIS — Z9181 History of falling: Secondary | ICD-10-CM | POA: Diagnosis not present

## 2018-09-12 DIAGNOSIS — E782 Mixed hyperlipidemia: Secondary | ICD-10-CM | POA: Diagnosis not present

## 2018-09-12 DIAGNOSIS — Z9049 Acquired absence of other specified parts of digestive tract: Secondary | ICD-10-CM | POA: Diagnosis not present

## 2018-09-12 DIAGNOSIS — I10 Essential (primary) hypertension: Secondary | ICD-10-CM | POA: Diagnosis not present

## 2018-09-12 DIAGNOSIS — F339 Major depressive disorder, recurrent, unspecified: Secondary | ICD-10-CM | POA: Diagnosis not present

## 2018-09-12 DIAGNOSIS — R296 Repeated falls: Secondary | ICD-10-CM | POA: Diagnosis not present

## 2018-09-12 DIAGNOSIS — M1991 Primary osteoarthritis, unspecified site: Secondary | ICD-10-CM | POA: Diagnosis not present

## 2018-09-12 DIAGNOSIS — K5909 Other constipation: Secondary | ICD-10-CM | POA: Diagnosis not present

## 2018-09-12 DIAGNOSIS — E038 Other specified hypothyroidism: Secondary | ICD-10-CM | POA: Diagnosis not present

## 2018-09-13 DIAGNOSIS — E038 Other specified hypothyroidism: Secondary | ICD-10-CM | POA: Diagnosis not present

## 2018-09-13 DIAGNOSIS — I10 Essential (primary) hypertension: Secondary | ICD-10-CM | POA: Diagnosis not present

## 2018-12-12 DIAGNOSIS — F3341 Major depressive disorder, recurrent, in partial remission: Secondary | ICD-10-CM | POA: Diagnosis not present

## 2018-12-12 DIAGNOSIS — F411 Generalized anxiety disorder: Secondary | ICD-10-CM | POA: Diagnosis not present

## 2018-12-12 DIAGNOSIS — R4189 Other symptoms and signs involving cognitive functions and awareness: Secondary | ICD-10-CM | POA: Diagnosis not present

## 2018-12-12 DIAGNOSIS — Z79899 Other long term (current) drug therapy: Secondary | ICD-10-CM | POA: Diagnosis not present

## 2018-12-12 DIAGNOSIS — Z9889 Other specified postprocedural states: Secondary | ICD-10-CM | POA: Diagnosis not present

## 2019-03-14 DIAGNOSIS — F3341 Major depressive disorder, recurrent, in partial remission: Secondary | ICD-10-CM | POA: Diagnosis not present

## 2019-03-14 DIAGNOSIS — F411 Generalized anxiety disorder: Secondary | ICD-10-CM | POA: Diagnosis not present

## 2019-03-14 DIAGNOSIS — Z9889 Other specified postprocedural states: Secondary | ICD-10-CM | POA: Diagnosis not present

## 2019-03-20 DIAGNOSIS — M47812 Spondylosis without myelopathy or radiculopathy, cervical region: Secondary | ICD-10-CM | POA: Diagnosis not present

## 2019-03-28 DIAGNOSIS — M47812 Spondylosis without myelopathy or radiculopathy, cervical region: Secondary | ICD-10-CM | POA: Diagnosis not present

## 2019-03-28 DIAGNOSIS — M542 Cervicalgia: Secondary | ICD-10-CM | POA: Diagnosis not present

## 2019-03-29 DIAGNOSIS — R52 Pain, unspecified: Secondary | ICD-10-CM | POA: Diagnosis not present

## 2019-03-29 DIAGNOSIS — Z79899 Other long term (current) drug therapy: Secondary | ICD-10-CM | POA: Diagnosis not present

## 2019-03-29 DIAGNOSIS — Z01818 Encounter for other preprocedural examination: Secondary | ICD-10-CM | POA: Diagnosis not present

## 2019-03-29 DIAGNOSIS — M47812 Spondylosis without myelopathy or radiculopathy, cervical region: Secondary | ICD-10-CM | POA: Diagnosis not present

## 2019-03-29 DIAGNOSIS — E559 Vitamin D deficiency, unspecified: Secondary | ICD-10-CM | POA: Diagnosis not present

## 2019-03-29 DIAGNOSIS — M5412 Radiculopathy, cervical region: Secondary | ICD-10-CM | POA: Diagnosis not present

## 2019-03-29 DIAGNOSIS — M79609 Pain in unspecified limb: Secondary | ICD-10-CM | POA: Diagnosis not present

## 2019-04-05 DIAGNOSIS — I1310 Hypertensive heart and chronic kidney disease without heart failure, with stage 1 through stage 4 chronic kidney disease, or unspecified chronic kidney disease: Secondary | ICD-10-CM | POA: Diagnosis not present

## 2019-04-05 DIAGNOSIS — R05 Cough: Secondary | ICD-10-CM | POA: Diagnosis not present

## 2019-04-05 DIAGNOSIS — E038 Other specified hypothyroidism: Secondary | ICD-10-CM | POA: Diagnosis not present

## 2019-04-05 DIAGNOSIS — M542 Cervicalgia: Secondary | ICD-10-CM | POA: Diagnosis not present

## 2019-04-05 DIAGNOSIS — N182 Chronic kidney disease, stage 2 (mild): Secondary | ICD-10-CM | POA: Diagnosis not present

## 2019-04-05 DIAGNOSIS — Z23 Encounter for immunization: Secondary | ICD-10-CM | POA: Diagnosis not present

## 2019-04-05 DIAGNOSIS — Z01810 Encounter for preprocedural cardiovascular examination: Secondary | ICD-10-CM | POA: Diagnosis not present

## 2019-04-05 DIAGNOSIS — E782 Mixed hyperlipidemia: Secondary | ICD-10-CM | POA: Diagnosis not present

## 2019-05-08 DIAGNOSIS — M542 Cervicalgia: Secondary | ICD-10-CM | POA: Diagnosis not present

## 2019-05-31 DIAGNOSIS — E782 Mixed hyperlipidemia: Secondary | ICD-10-CM | POA: Diagnosis not present

## 2019-05-31 DIAGNOSIS — R296 Repeated falls: Secondary | ICD-10-CM | POA: Diagnosis not present

## 2019-05-31 DIAGNOSIS — E038 Other specified hypothyroidism: Secondary | ICD-10-CM | POA: Diagnosis not present

## 2019-05-31 DIAGNOSIS — R413 Other amnesia: Secondary | ICD-10-CM | POA: Diagnosis not present

## 2019-05-31 DIAGNOSIS — R05 Cough: Secondary | ICD-10-CM | POA: Diagnosis not present

## 2019-05-31 DIAGNOSIS — R2689 Other abnormalities of gait and mobility: Secondary | ICD-10-CM | POA: Diagnosis not present

## 2019-05-31 DIAGNOSIS — M542 Cervicalgia: Secondary | ICD-10-CM | POA: Diagnosis not present

## 2019-06-13 DIAGNOSIS — R4189 Other symptoms and signs involving cognitive functions and awareness: Secondary | ICD-10-CM | POA: Diagnosis not present

## 2019-06-13 DIAGNOSIS — F29 Unspecified psychosis not due to a substance or known physiological condition: Secondary | ICD-10-CM | POA: Diagnosis not present

## 2019-06-13 DIAGNOSIS — Z9889 Other specified postprocedural states: Secondary | ICD-10-CM | POA: Diagnosis not present

## 2019-06-13 DIAGNOSIS — F411 Generalized anxiety disorder: Secondary | ICD-10-CM | POA: Diagnosis not present

## 2019-06-13 DIAGNOSIS — F3341 Major depressive disorder, recurrent, in partial remission: Secondary | ICD-10-CM | POA: Diagnosis not present

## 2019-06-15 DIAGNOSIS — M2560 Stiffness of unspecified joint, not elsewhere classified: Secondary | ICD-10-CM | POA: Diagnosis not present

## 2019-06-15 DIAGNOSIS — M542 Cervicalgia: Secondary | ICD-10-CM | POA: Diagnosis not present

## 2019-06-15 DIAGNOSIS — R531 Weakness: Secondary | ICD-10-CM | POA: Diagnosis not present

## 2019-07-03 DIAGNOSIS — M542 Cervicalgia: Secondary | ICD-10-CM | POA: Diagnosis not present

## 2019-07-03 DIAGNOSIS — R531 Weakness: Secondary | ICD-10-CM | POA: Diagnosis not present

## 2019-07-03 DIAGNOSIS — R296 Repeated falls: Secondary | ICD-10-CM | POA: Diagnosis not present

## 2019-07-03 DIAGNOSIS — M2560 Stiffness of unspecified joint, not elsewhere classified: Secondary | ICD-10-CM | POA: Diagnosis not present

## 2019-07-04 ENCOUNTER — Other Ambulatory Visit: Payer: Self-pay | Admitting: Family Medicine

## 2019-07-05 DIAGNOSIS — M2560 Stiffness of unspecified joint, not elsewhere classified: Secondary | ICD-10-CM | POA: Diagnosis not present

## 2019-07-05 DIAGNOSIS — R531 Weakness: Secondary | ICD-10-CM | POA: Diagnosis not present

## 2019-07-05 DIAGNOSIS — R296 Repeated falls: Secondary | ICD-10-CM | POA: Diagnosis not present

## 2019-07-05 DIAGNOSIS — M542 Cervicalgia: Secondary | ICD-10-CM | POA: Diagnosis not present

## 2019-07-09 ENCOUNTER — Other Ambulatory Visit: Payer: Self-pay

## 2019-07-09 MED ORDER — LEVOTHYROXINE SODIUM 50 MCG PO TABS: 50.0000 ug | ORAL_TABLET | Freq: Every day | ORAL | 1 refills | Status: DC

## 2019-07-10 DIAGNOSIS — R296 Repeated falls: Secondary | ICD-10-CM | POA: Diagnosis not present

## 2019-07-10 DIAGNOSIS — M2560 Stiffness of unspecified joint, not elsewhere classified: Secondary | ICD-10-CM | POA: Diagnosis not present

## 2019-07-10 DIAGNOSIS — R531 Weakness: Secondary | ICD-10-CM | POA: Diagnosis not present

## 2019-07-10 DIAGNOSIS — M542 Cervicalgia: Secondary | ICD-10-CM | POA: Diagnosis not present

## 2019-07-11 ENCOUNTER — Other Ambulatory Visit: Payer: Self-pay

## 2019-07-16 NOTE — Patient Instructions (Signed)
Understanding Your Risk for Falls Each year, millions of people have serious injuries from falls. It is important to understand your risk for falling. Talk with your health care provider about your risk and what you can do to lower it. There are actions you can take at home to lower your risk. If you do have a serious fall, make sure you tell your health care provider. Falling once raises your risk for falling again. How can falls affect me? Serious injuries from falls are common. These include:  Broken bones, such as hip fractures.  Head injuries, such as traumatic brain injuries (TBI). Fear of falling can also cause you to avoid activities and stay at home. This can make your muscles weaker and actually raise your risk for a fall. What can increase my risk? There are a number of risk factors that increase your risk for falling. The more risk factors you have, the higher your risk for falling. Serious injuries from a fall most often happen to people older than age 91. Children and young adults ages 20-29 are also at higher risk. Common risk factors include:  Weakness in the lower body.  Lack (deficiency) of vitamin D.  Being generally weak or confused due to long-term (chronic) illness.  Dizziness or balance problems.  Poor vision.  Medicines that cause dizziness or drowsiness. These can include medicines for your blood pressure, heart, anxiety, insomnia, or edema, as well as pain medicines and muscle relaxants. Other risk factors include:  Drinking alcohol.  Having had a fall in the past.  Having depression.  Foot pain or improper footwear.  Working at a dangerous job.  Having any of the following in your home: ? Tripping hazards, such as floor clutter or loose rugs. ? Poor lighting. ? Pets or clutter.  Dementia or memory loss. What actions can I take to lower my risk of falling?     Physical activity Maintain physical fitness. Do strength and balance exercises.  Consider taking a regular class to build strength and balance. Yoga and tai chi are good options. Vision Have your eyes checked every year and your vision prescription updated as needed. Walking aids and footwear  Wear nonskid shoes. Do not wear high heels.  Do not walk around the house in socks or slippers.  Use a cane or walker as told by your health care provider. Home safety  Attach secure railings on both sides of your stairs.  Install grab bars for your tub, shower, and toilet. Use a bath mat in your tub or shower.  Use good lighting in all rooms. Keep a flashlight near your bed.  Make sure there is a clear path from your bed to the bathroom. Use night-lights.  Do not use throw rugs. Make sure all carpeting is taped or tacked down securely.  Remove all clutter from walkways and stairways, including extension cords.  Repair uneven or broken steps.  Avoid walking on icy or slippery surfaces. Walk on the grass instead of on icy or slick sidewalks. Where you can, use ice melt to get rid of ice on walkways.  Use a cordless phone. Questions to ask your health care provider  Can you help me check my risk for a fall?  Do any of my medicines make me more likely to fall?  Should I take a vitamin D supplement?  What exercises can I do to improve my strength and balance?  Should I make an appointment to have my vision checked?  Do I  need a bone density test to check for weak bones or osteoporosis?  Would it help to use a cane or a walker? Where to find more information  Centers for Disease Control and Prevention, [place]AD[place]: www.cdc.gov  Community-Based Fall Prevention Programs: www.cdc.gov  National Institute on Aging: go4life.nia.nih.gov Contact a health care provider if:  You fall at home.  You are afraid of falling at home.  You feel weak, drowsy, or dizzy. Summary  People 65 and older are at high risk for falling. However, older people are not the only ones  injured in falls. Children and young adults have a higher-than-normal risk too.  Talk with your health care provider about your risks for falling and how to lower those risks.  Taking certain precautions at home can lower your risk for falling.  If you fall, always tell your health care provider. This information is not intended to replace advice given to you by your health care provider. Make sure you discuss any questions you have with your health care provider. Document Revised: 11/22/2018 Document Reviewed: 11/22/2018 Elsevier Patient Education  2020 Elsevier Inc.  

## 2019-07-17 ENCOUNTER — Other Ambulatory Visit: Payer: Self-pay

## 2019-07-17 DIAGNOSIS — R296 Repeated falls: Secondary | ICD-10-CM | POA: Diagnosis not present

## 2019-07-17 DIAGNOSIS — M2560 Stiffness of unspecified joint, not elsewhere classified: Secondary | ICD-10-CM | POA: Diagnosis not present

## 2019-07-17 DIAGNOSIS — R531 Weakness: Secondary | ICD-10-CM | POA: Diagnosis not present

## 2019-07-17 DIAGNOSIS — M542 Cervicalgia: Secondary | ICD-10-CM | POA: Diagnosis not present

## 2019-07-20 DIAGNOSIS — R531 Weakness: Secondary | ICD-10-CM | POA: Diagnosis not present

## 2019-07-20 DIAGNOSIS — R296 Repeated falls: Secondary | ICD-10-CM | POA: Diagnosis not present

## 2019-07-20 DIAGNOSIS — M2560 Stiffness of unspecified joint, not elsewhere classified: Secondary | ICD-10-CM | POA: Diagnosis not present

## 2019-07-20 DIAGNOSIS — M542 Cervicalgia: Secondary | ICD-10-CM | POA: Diagnosis not present

## 2019-07-23 DIAGNOSIS — M2560 Stiffness of unspecified joint, not elsewhere classified: Secondary | ICD-10-CM | POA: Diagnosis not present

## 2019-07-23 DIAGNOSIS — R531 Weakness: Secondary | ICD-10-CM | POA: Diagnosis not present

## 2019-07-23 DIAGNOSIS — M542 Cervicalgia: Secondary | ICD-10-CM | POA: Diagnosis not present

## 2019-07-23 DIAGNOSIS — R296 Repeated falls: Secondary | ICD-10-CM | POA: Diagnosis not present

## 2019-07-23 NOTE — Progress Notes (Deleted)
Patient cancelled appointment due to scheduling conflict.

## 2019-07-25 DIAGNOSIS — M542 Cervicalgia: Secondary | ICD-10-CM | POA: Diagnosis not present

## 2019-07-25 DIAGNOSIS — R296 Repeated falls: Secondary | ICD-10-CM | POA: Diagnosis not present

## 2019-07-25 DIAGNOSIS — R531 Weakness: Secondary | ICD-10-CM | POA: Diagnosis not present

## 2019-07-25 DIAGNOSIS — M2560 Stiffness of unspecified joint, not elsewhere classified: Secondary | ICD-10-CM | POA: Diagnosis not present

## 2019-07-30 ENCOUNTER — Other Ambulatory Visit: Payer: Self-pay

## 2019-07-30 ENCOUNTER — Telehealth (INDEPENDENT_AMBULATORY_CARE_PROVIDER_SITE_OTHER): Payer: Medicare HMO | Admitting: Legal Medicine

## 2019-07-30 ENCOUNTER — Encounter: Payer: Self-pay | Admitting: Legal Medicine

## 2019-07-30 ENCOUNTER — Telehealth: Payer: Self-pay

## 2019-07-30 VITALS — BP 99/60

## 2019-07-30 DIAGNOSIS — N390 Urinary tract infection, site not specified: Secondary | ICD-10-CM

## 2019-07-30 DIAGNOSIS — N3000 Acute cystitis without hematuria: Secondary | ICD-10-CM

## 2019-07-30 DIAGNOSIS — E782 Mixed hyperlipidemia: Secondary | ICD-10-CM

## 2019-07-30 DIAGNOSIS — I10 Essential (primary) hypertension: Secondary | ICD-10-CM

## 2019-07-30 DIAGNOSIS — E038 Other specified hypothyroidism: Secondary | ICD-10-CM

## 2019-07-30 HISTORY — DX: Hereditary hemochromatosis: E83.110

## 2019-07-30 LAB — POCT URINALYSIS DIPSTICK
Bilirubin, UA: NEGATIVE
Blood, UA: NEGATIVE
Glucose, UA: NEGATIVE
Ketones, UA: NEGATIVE
Nitrite, UA: NEGATIVE
Protein, UA: POSITIVE — AB
Spec Grav, UA: 1.02 (ref 1.010–1.025)
Urobilinogen, UA: 0.2 E.U./dL
pH, UA: 6 (ref 5.0–8.0)

## 2019-07-30 MED ORDER — CIPROFLOXACIN HCL 250 MG PO TABS: 250.0000 mg | ORAL_TABLET | Freq: Two times a day (BID) | ORAL | 0 refills | Status: DC

## 2019-07-30 NOTE — Patient Instructions (Signed)
Urinary Tract Infection, Adult A urinary tract infection (UTI) is an infection of any part of the urinary tract. The urinary tract includes:  The kidneys.  The ureters.  The bladder.  The urethra. These organs make, store, and get rid of pee (urine) in the body. What are the causes? This is caused by germs (bacteria) in your genital area. These germs grow and cause swelling (inflammation) of your urinary tract. What increases the risk? You are more likely to develop this condition if:  You have a small, thin tube (catheter) to drain pee.  You cannot control when you pee or poop (incontinence).  You are female, and: ? You use these methods to prevent pregnancy:  A medicine that kills sperm (spermicide).  A device that blocks sperm (diaphragm). ? You have low levels of a female hormone (estrogen). ? You are pregnant.  You have genes that add to your risk.  You are sexually active.  You take antibiotic medicines.  You have trouble peeing because of: ? A prostate that is bigger than normal, if you are female. ? A blockage in the part of your body that drains pee from the bladder (urethra). ? A kidney stone. ? A nerve condition that affects your bladder (neurogenic bladder). ? Not getting enough to drink. ? Not peeing often enough.  You have other conditions, such as: ? Diabetes. ? A weak disease-fighting system (immune system). ? Sickle cell disease. ? Gout. ? Injury of the spine. What are the signs or symptoms? Symptoms of this condition include:  Needing to pee right away (urgently).  Peeing often.  Peeing small amounts often.  Pain or burning when peeing.  Blood in the pee.  Pee that smells bad or not like normal.  Trouble peeing.  Pee that is cloudy.  Fluid coming from the vagina, if you are female.  Pain in the belly or lower back. Other symptoms include:  Throwing up (vomiting).  No urge to eat.  Feeling mixed up (confused).  Being tired  and grouchy (irritable).  A fever.  Watery poop (diarrhea). How is this treated? This condition may be treated with:  Antibiotic medicine.  Other medicines.  Drinking enough water. Follow these instructions at home:  Medicines  Take over-the-counter and prescription medicines only as told by your doctor.  If you were prescribed an antibiotic medicine, take it as told by your doctor. Do not stop taking it even if you start to feel better. General instructions  Make sure you: ? Pee until your bladder is empty. ? Do not hold pee for a long time. ? Empty your bladder after sex. ? Wipe from front to back after pooping if you are a female. Use each tissue one time when you wipe.  Drink enough fluid to keep your pee pale yellow.  Keep all follow-up visits as told by your doctor. This is important. Contact a doctor if:  You do not get better after 1-2 days.  Your symptoms go away and then come back. Get help right away if:  You have very bad back pain.  You have very bad pain in your lower belly.  You have a fever.  You are sick to your stomach (nauseous).  You are throwing up. Summary  A urinary tract infection (UTI) is an infection of any part of the urinary tract.  This condition is caused by germs in your genital area.  There are many risk factors for a UTI. These include having a small, thin   tube to drain pee and not being able to control when you pee or poop.  Treatment includes antibiotic medicines for germs.  Drink enough fluid to keep your pee pale yellow. This information is not intended to replace advice given to you by your health care provider. Make sure you discuss any questions you have with your health care provider. Document Revised: 05/04/2018 Document Reviewed: 11/24/2017 Elsevier Patient Education  2020 Elsevier Inc.  

## 2019-07-30 NOTE — Progress Notes (Signed)
Positive leukocytes, needs C & S lp

## 2019-07-30 NOTE — Progress Notes (Signed)
Patient is contacted with telephone.  Her daughter spoke since pain is having some delerium and is sleeping because she took all her medicines this AM for the day.  The patient is in no distress.  The urine brought in by daughter shows 3+ leukocytes.  No PE due to telephone visit.  Assessment UTI  Plan: cipro 250mg  bid for 7 days.  Culture urine

## 2019-07-30 NOTE — Telephone Encounter (Signed)
Patient's daughter brought her in for a UA, results indicate UTI.  Virtual visit scheduled with Dr Henrene Pastor for this afternoon.

## 2019-07-30 NOTE — Telephone Encounter (Signed)
[name] called this morning stating that her mother, Michele Parker, took all of her medication for today, including her night medication, at one time this morning.  I requested she monitor her blood pressure throughout the day.  She also reports that she has noticed an altered mental status for the past two-three days.  She does not know if she is able to get her to the office to be seen by one of the providers, she is going to try after patient eats breakfast this morning.  If she can't get her here she is going to make an appointment for a virtual visit.  She is also going to try to get a urine samplefor a u/a.

## 2019-07-31 ENCOUNTER — Other Ambulatory Visit: Payer: Medicare HMO

## 2019-08-01 LAB — URINE CULTURE

## 2019-08-14 ENCOUNTER — Other Ambulatory Visit (INDEPENDENT_AMBULATORY_CARE_PROVIDER_SITE_OTHER): Payer: Medicare HMO

## 2019-08-14 ENCOUNTER — Other Ambulatory Visit: Payer: Self-pay

## 2019-08-14 DIAGNOSIS — E782 Mixed hyperlipidemia: Secondary | ICD-10-CM | POA: Diagnosis not present

## 2019-08-14 DIAGNOSIS — I10 Essential (primary) hypertension: Secondary | ICD-10-CM | POA: Diagnosis not present

## 2019-08-14 DIAGNOSIS — E038 Other specified hypothyroidism: Secondary | ICD-10-CM

## 2019-08-14 DIAGNOSIS — N390 Urinary tract infection, site not specified: Secondary | ICD-10-CM | POA: Diagnosis not present

## 2019-08-14 LAB — POCT URINALYSIS DIP (CLINITEK)
Bilirubin, UA: NEGATIVE
Blood, UA: NEGATIVE
Glucose, UA: NEGATIVE mg/dL
Ketones, POC UA: NEGATIVE mg/dL
Nitrite, UA: NEGATIVE
POC PROTEIN,UA: NEGATIVE
Spec Grav, UA: 1.02 (ref 1.010–1.025)
Urobilinogen, UA: 0.2 E.U./dL
pH, UA: 6 (ref 5.0–8.0)

## 2019-08-15 LAB — CBC WITH DIFFERENTIAL/PLATELET
Basophils Absolute: 0.1 10*3/uL (ref 0.0–0.2)
Basos: 1 %
EOS (ABSOLUTE): 0.2 10*3/uL (ref 0.0–0.4)
Eos: 3 %
Hematocrit: 42.5 % (ref 34.0–46.6)
Hemoglobin: 14.2 g/dL (ref 11.1–15.9)
Immature Grans (Abs): 0 10*3/uL (ref 0.0–0.1)
Immature Granulocytes: 1 %
Lymphocytes Absolute: 1.4 10*3/uL (ref 0.7–3.1)
Lymphs: 22 %
MCH: 30.1 pg (ref 26.6–33.0)
MCHC: 33.4 g/dL (ref 31.5–35.7)
MCV: 90 fL (ref 79–97)
Monocytes Absolute: 0.7 10*3/uL (ref 0.1–0.9)
Monocytes: 10 %
Neutrophils Absolute: 4.1 10*3/uL (ref 1.4–7.0)
Neutrophils: 63 %
Platelets: 239 10*3/uL (ref 150–450)
RBC: 4.71 x10E6/uL (ref 3.77–5.28)
RDW: 12 % (ref 11.7–15.4)
WBC: 6.5 10*3/uL (ref 3.4–10.8)

## 2019-08-15 LAB — COMPREHENSIVE METABOLIC PANEL
ALT: 16 IU/L (ref 0–32)
AST: 20 IU/L (ref 0–40)
Albumin/Globulin Ratio: 2.5 — ABNORMAL HIGH (ref 1.2–2.2)
Albumin: 4.5 g/dL (ref 3.6–4.6)
Alkaline Phosphatase: 89 IU/L (ref 39–117)
BUN/Creatinine Ratio: 21 (ref 12–28)
BUN: 22 mg/dL (ref 8–27)
Bilirubin Total: 0.5 mg/dL (ref 0.0–1.2)
CO2: 24 mmol/L (ref 20–29)
Calcium: 10.1 mg/dL (ref 8.7–10.3)
Chloride: 100 mmol/L (ref 96–106)
Creatinine, Ser: 1.06 mg/dL — ABNORMAL HIGH (ref 0.57–1.00)
GFR calc Af Amer: 57 mL/min/{1.73_m2} — ABNORMAL LOW (ref >59)
GFR calc non Af Amer: 49 mL/min/{1.73_m2} — ABNORMAL LOW (ref >59)
Globulin, Total: 1.8 g/dL (ref 1.5–4.5)
Glucose: 102 mg/dL — ABNORMAL HIGH (ref 65–99)
Potassium: 4.5 mmol/L (ref 3.5–5.2)
Sodium: 137 mmol/L (ref 134–144)
Total Protein: 6.3 g/dL (ref 6.0–8.5)

## 2019-08-15 LAB — LIPID PANEL
Chol/HDL Ratio: 3.4 ratio (ref 0.0–4.4)
Cholesterol, Total: 238 mg/dL — ABNORMAL HIGH (ref 100–199)
HDL: 70 mg/dL (ref >39)
LDL Chol Calc (NIH): 141 mg/dL — ABNORMAL HIGH (ref 0–99)
Triglycerides: 156 mg/dL — ABNORMAL HIGH (ref 0–149)
VLDL Cholesterol Cal: 27 mg/dL (ref 5–40)

## 2019-08-15 LAB — TSH: TSH: 3.38 u[IU]/mL (ref 0.450–4.500)

## 2019-08-15 LAB — CARDIOVASCULAR RISK ASSESSMENT

## 2019-08-16 ENCOUNTER — Other Ambulatory Visit: Payer: Self-pay | Admitting: Legal Medicine

## 2019-08-16 LAB — URINE CULTURE

## 2019-08-21 ENCOUNTER — Other Ambulatory Visit: Payer: Self-pay

## 2019-08-29 NOTE — Progress Notes (Signed)
This encounter was created in error - please disregard.

## 2019-09-03 ENCOUNTER — Encounter: Payer: Self-pay | Admitting: Family Medicine

## 2019-09-03 NOTE — Progress Notes (Signed)
Patient no showed or cancelled her appt.   [name] Md

## 2019-09-04 ENCOUNTER — Ambulatory Visit (INDEPENDENT_AMBULATORY_CARE_PROVIDER_SITE_OTHER): Payer: Medicare HMO | Admitting: Family Medicine

## 2019-09-04 DIAGNOSIS — Z5329 Procedure and treatment not carried out because of patient's decision for other reasons: Secondary | ICD-10-CM

## 2019-09-06 ENCOUNTER — Encounter: Payer: Self-pay | Admitting: Family Medicine

## 2019-09-11 DIAGNOSIS — Z9889 Other specified postprocedural states: Secondary | ICD-10-CM | POA: Diagnosis not present

## 2019-09-11 DIAGNOSIS — R4189 Other symptoms and signs involving cognitive functions and awareness: Secondary | ICD-10-CM | POA: Diagnosis not present

## 2019-09-11 DIAGNOSIS — R251 Tremor, unspecified: Secondary | ICD-10-CM | POA: Diagnosis not present

## 2019-09-11 DIAGNOSIS — F29 Unspecified psychosis not due to a substance or known physiological condition: Secondary | ICD-10-CM | POA: Diagnosis not present

## 2019-09-11 DIAGNOSIS — F411 Generalized anxiety disorder: Secondary | ICD-10-CM | POA: Diagnosis not present

## 2019-09-11 DIAGNOSIS — F3341 Major depressive disorder, recurrent, in partial remission: Secondary | ICD-10-CM | POA: Diagnosis not present

## 2019-09-12 NOTE — Progress Notes (Signed)
No showed. Kc  

## 2019-09-13 ENCOUNTER — Ambulatory Visit (INDEPENDENT_AMBULATORY_CARE_PROVIDER_SITE_OTHER): Payer: Medicare HMO | Admitting: Family Medicine

## 2019-09-13 DIAGNOSIS — E038 Other specified hypothyroidism: Secondary | ICD-10-CM

## 2019-09-13 DIAGNOSIS — I10 Essential (primary) hypertension: Secondary | ICD-10-CM

## 2019-09-13 DIAGNOSIS — E782 Mixed hyperlipidemia: Secondary | ICD-10-CM

## 2019-09-19 ENCOUNTER — Other Ambulatory Visit: Payer: Self-pay

## 2019-09-19 ENCOUNTER — Ambulatory Visit (INDEPENDENT_AMBULATORY_CARE_PROVIDER_SITE_OTHER): Payer: Medicare HMO

## 2019-09-19 DIAGNOSIS — R410 Disorientation, unspecified: Secondary | ICD-10-CM

## 2019-09-19 LAB — POCT URINALYSIS DIPSTICK
Bilirubin, UA: NEGATIVE
Blood, UA: NEGATIVE
Glucose, UA: NEGATIVE
Ketones, UA: NEGATIVE
Leukocytes, UA: NEGATIVE
Nitrite, UA: NEGATIVE
Protein, UA: NEGATIVE
Spec Grav, UA: 1.015 (ref 1.010–1.025)
Urobilinogen, UA: 0.2 E.U./dL
pH, UA: 6 (ref 5.0–8.0)

## 2019-09-19 NOTE — Progress Notes (Signed)
Patient came in today for a urine check.

## 2019-09-20 LAB — URINE CULTURE

## 2019-10-02 ENCOUNTER — Other Ambulatory Visit: Payer: Self-pay | Admitting: Family Medicine

## 2019-12-20 DIAGNOSIS — F29 Unspecified psychosis not due to a substance or known physiological condition: Secondary | ICD-10-CM | POA: Diagnosis not present

## 2019-12-20 DIAGNOSIS — F3341 Major depressive disorder, recurrent, in partial remission: Secondary | ICD-10-CM | POA: Diagnosis not present

## 2019-12-20 DIAGNOSIS — F411 Generalized anxiety disorder: Secondary | ICD-10-CM | POA: Diagnosis not present

## 2019-12-20 DIAGNOSIS — R4189 Other symptoms and signs involving cognitive functions and awareness: Secondary | ICD-10-CM | POA: Diagnosis not present

## 2019-12-20 DIAGNOSIS — Z9889 Other specified postprocedural states: Secondary | ICD-10-CM | POA: Diagnosis not present

## 2019-12-21 ENCOUNTER — Other Ambulatory Visit: Payer: Self-pay

## 2019-12-21 MED ORDER — LEVOTHYROXINE SODIUM 50 MCG PO TABS: 50.0000 ug | ORAL_TABLET | Freq: Every day | ORAL | 1 refills | Status: DC

## 2019-12-31 ENCOUNTER — Other Ambulatory Visit: Payer: Self-pay | Admitting: Physician Assistant

## 2020-01-16 ENCOUNTER — Other Ambulatory Visit: Payer: Self-pay | Admitting: Family Medicine

## 2020-01-17 ENCOUNTER — Other Ambulatory Visit: Payer: Self-pay

## 2020-01-17 ENCOUNTER — Ambulatory Visit (INDEPENDENT_AMBULATORY_CARE_PROVIDER_SITE_OTHER): Payer: Medicare HMO | Admitting: Family Medicine

## 2020-01-17 ENCOUNTER — Encounter: Payer: Self-pay | Admitting: Family Medicine

## 2020-01-17 VITALS — BP 110/58 | HR 80 | Temp 97.6°F | Wt 144.2 lb

## 2020-01-17 DIAGNOSIS — E039 Hypothyroidism, unspecified: Secondary | ICD-10-CM

## 2020-01-17 DIAGNOSIS — R4182 Altered mental status, unspecified: Secondary | ICD-10-CM | POA: Diagnosis not present

## 2020-01-17 DIAGNOSIS — R4781 Slurred speech: Secondary | ICD-10-CM | POA: Diagnosis not present

## 2020-01-17 DIAGNOSIS — R410 Disorientation, unspecified: Secondary | ICD-10-CM

## 2020-01-17 DIAGNOSIS — E559 Vitamin D deficiency, unspecified: Secondary | ICD-10-CM

## 2020-01-17 DIAGNOSIS — N289 Disorder of kidney and ureter, unspecified: Secondary | ICD-10-CM | POA: Diagnosis not present

## 2020-01-17 DIAGNOSIS — R4701 Aphasia: Secondary | ICD-10-CM | POA: Diagnosis not present

## 2020-01-17 MED ORDER — MEMANTINE HCL 5 MG PO TABS
5.0000 mg | ORAL_TABLET | Freq: Two times a day (BID) | ORAL | 0 refills | Status: DC
Start: 2020-01-17 — End: 2020-02-10

## 2020-01-17 NOTE — Progress Notes (Signed)
Acute Office Visit  Subjective:    Patient ID: Michele Parker, female    DOB: [birth year], 79 y.o.   MRN: [id number]  Chief Complaint  Patient presents with  . Altered Mental Status    difficulty expressing herself this morning     HPI Patient is in today for with memory loss and this morning had an episode of speech difficulty. Was not right when she got out of bed this morning. She has had memory issues over the past, but it worsened this morning.  Daughter would like her urine checked.  Patient has had no falls or trauma to her head.  Past Medical History:  Diagnosis Date  . Angioedema   . Bradycardia   . Cervicalgia   . Depression   . Essential hypertension   . Hemochromatosis   . Hemorrhoid   . Hereditary hemochromatosis ([place])   . HTN (hypertension)   . Hypercalcemia   . Hyperlipidemia   . Hypothyroid   . Memory loss   . Osteopenia   . Osteopenia   . Other amnesia   . Other constipation   . Repeated falls   . Tremor   . Vitamin D deficiency     Past Surgical History:  Procedure Laterality Date  . CHOLECYSTECTOMY    . KNEE SURGERY    . MINOR HEMORRHOIDECTOMY    . SHOULDER SURGERY    . TUBAL LIGATION      Family History  Problem Relation Age of Onset  . Heart attack Mother   . Heart disease Mother   . CAD Brother   . Alcohol abuse Brother   . Other Father        unsure of medical hx - died in MVA when patient was 79 years old    Social History   Socioeconomic History  . Marital status: Married    Spouse name: Not on file  . Number of children: 2  . Years of education: 28  . Highest education level: High school graduate  Occupational History  . Occupation: Retired-hosiery  Tobacco Use  . Smoking status: Never Smoker  . Smokeless tobacco: Never Used  Vaping Use  . Vaping Use: Never used  Substance and Sexual Activity  . Alcohol use: No  . Drug use: No  . Sexual activity: Not on file  Other Topics Concern  . Not on file  Social History  Narrative   Lives at home with her husband.   Left-handed.   2 cups caffeine per day.   Social Determinants of Health   Financial Resource Strain:   . Difficulty of Paying Living Expenses: Not on file  Food Insecurity:   . Worried About Charity fundraiser in the Last Year: Not on file  . Ran Out of Food in the Last Year: Not on file  Transportation Needs:   . Lack of Transportation (Medical): Not on file  . Lack of Transportation (Non-Medical): Not on file  Physical Activity:   . Days of Exercise per Week: Not on file  . Minutes of Exercise per Session: Not on file  Stress:   . Feeling of Stress : Not on file  Social Connections:   . Frequency of Communication with Friends and Family: Not on file  . Frequency of Social Gatherings with Friends and Family: Not on file  . Attends Religious Services: Not on file  . Active Member of Clubs or Organizations: Not on file  . Attends Archivist Meetings: Not  on file  . Marital Status: Not on file  Intimate Partner Violence:   . Fear of Current or Ex-Partner: Not on file  . Emotionally Abused: Not on file  . Physically Abused: Not on file  . Sexually Abused: Not on file    Outpatient Medications Prior to Visit  Medication Sig Dispense Refill  . memantine (NAMENDA) 5 MG tablet Take 5 mg by mouth daily.    . benazepril-hydrochlorthiazide (LOTENSIN HCT) 20-12.5 MG tablet TAKE 1 TABLET TWICE DAILY 180 tablet 0  . buPROPion (WELLBUTRIN XL) 150 MG 24 hr tablet Take 150 mg by mouth 2 (two) times daily.    . Cholecalciferol (VITAMIN D3) 2000 UNITS TABS Take 1,000 Units by mouth.     . famotidine (PEPCID) 20 MG tablet     . FLUoxetine (PROZAC) 10 MG capsule Take 10 mg by mouth 2 (two) times daily.    [name] levothyroxine (SYNTHROID) 50 MCG tablet Take 1 tablet (50 mcg total) by mouth daily before breakfast. 90 tablet 1  . Multiple Vitamin (MULTIVITAMIN) capsule Take 1 capsule by mouth daily.    [name] OLANZapine (ZYPREXA) 5 MG tablet Take by  mouth.    . potassium chloride (MICRO-K) 10 MEQ CR capsule TAKE 1 CAPSULE EVERY DAY 90 capsule 1  . ciprofloxacin (CIPRO) 250 MG tablet Take 1 tablet (250 mg total) by mouth 2 (two) times daily. 14 tablet 0  . Red Yeast Rice Extract 600 MG TABS Take 600 mg by mouth daily.    . traMADol (ULTRAM) 50 MG tablet      No facility-administered medications prior to visit.    Allergies  Allergen Reactions  . Bystolic [Nebivolol Hcl] Other (See Comments)    Bradycardia  . Aricept [Donepezil]     Nausea and vomiting.  . Lisinopril Swelling  . Other     Other reaction(s): GI Upset (intolerance)  . Tylox [Oxycodone-Acetaminophen]     Review of Systems  Constitutional: Positive for fatigue. Negative for chills and fever.  HENT: Negative for congestion, rhinorrhea and sore throat.   Respiratory: Negative for cough and shortness of breath.   Cardiovascular: Negative for chest pain.  Gastrointestinal: Negative for abdominal pain, constipation, diarrhea, nausea and vomiting.  Genitourinary: Negative for dysuria and urgency.  Musculoskeletal: Positive for arthralgias and back pain. Negative for myalgias.  Neurological: Positive for speech difficulty (problems expressing herself) and weakness. Negative for dizziness, light-headedness and headaches.  Psychiatric/Behavioral: Positive for confusion. Negative for dysphoric mood. The patient is not nervous/anxious.        Objective:    Physical Exam Vitals reviewed.  Constitutional:      General: She is not in acute distress.    Appearance: Normal appearance. She is normal weight.  HENT:     Right Ear: Tympanic membrane and ear canal normal.     Left Ear: Tympanic membrane and ear canal normal.     Nose: Nose normal. No congestion or rhinorrhea.     Mouth/Throat:     Mouth: Mucous membranes are moist.  Neck:     Thyroid: No thyroid mass.     Vascular: No carotid bruit.  Cardiovascular:     Rate and Rhythm: Normal rate and regular rhythm.      Heart sounds: Normal heart sounds. No murmur heard.   Pulmonary:     Effort: Pulmonary effort is normal.     Breath sounds: Normal breath sounds.  Abdominal:     General: Bowel sounds are normal.  Palpations: Abdomen is soft. There is no mass.     Tenderness: There is no abdominal tenderness.  Lymphadenopathy:     Cervical: No cervical adenopathy.  Neurological:     Mental Status: She is alert.     Cranial Nerves: No cranial nerve deficit.     Comments: MMSE 24/30. Bilateral tremor. Negative Romberg's.  Negative pronator drift.  Psychiatric:        Mood and Affect: Mood normal.        Behavior: Behavior normal.     BP (!) 110/58   Pulse 80   Temp 97.6 F (36.4 C)   Wt 144 lb 3.2 oz (65.4 kg)   BMI 26.37 kg/m  Wt Readings from Last 3 Encounters:  01/17/20 144 lb 3.2 oz (65.4 kg)  12/27/17 126 lb 8 oz (57.4 kg)  09/27/17 127 lb 12 oz (57.9 kg)    Health Maintenance Due  Topic Date Due  . COVID-19 Vaccine (1) Never done  . MAMMOGRAM  07/18/2018  . INFLUENZA VACCINE  12/30/2019    There are no preventive care reminders to display for this patient.   Lab Results  Component Value Date   TSH 3.380 08/14/2019   Lab Results  Component Value Date   WBC 6.5 08/14/2019   HGB 14.2 08/14/2019   HCT 42.5 08/14/2019   MCV 90 08/14/2019   PLT 239 08/14/2019   Lab Results  Component Value Date   NA 137 08/14/2019   K 4.5 08/14/2019   CO2 24 08/14/2019   GLUCOSE 102 (H) 08/14/2019   BUN 22 08/14/2019   CREATININE 1.06 (H) 08/14/2019   BILITOT 0.5 08/14/2019   ALKPHOS 89 08/14/2019   AST 20 08/14/2019   ALT 16 08/14/2019   PROT 6.3 08/14/2019   ALBUMIN 4.5 08/14/2019   CALCIUM 10.1 08/14/2019   GFR 89.64 07/20/2013   Lab Results  Component Value Date   CHOL 238 (H) 08/14/2019   Lab Results  Component Value Date   HDL 70 08/14/2019   Lab Results  Component Value Date   LDLCALC 141 (H) 08/14/2019   Lab Results  Component Value Date   TRIG 156  (H) 08/14/2019   Lab Results  Component Value Date   CHOLHDL 3.4 08/14/2019   No results found for: HGBA1C     Assessment & Plan:  1. Aphasia - CT Head Wo Contrast; Future  2. Disorientation/memory loss: Increase Namenda to 5 mg twice daily - CBC with Differential/Platelet - Comprehensive metabolic panel - Y81 and Folate Panel - CT Head Wo Contrast; Future - Urinalysis Dipstick  3. Mild vitamin D deficiency - VITAMIN D 25 Hydroxy (Vit-D Deficiency, Fractures); Future  4. Hypomagnesemia - Magnesium  5. Hypothyroidism, unspecified type - TSH  6. Hereditary hemochromatosis ([place]) - Ferritin  7. Abnormal renal function - Comprehensive metabolic panel; Future    Orders Placed This Encounter  Procedures  . CT Head Wo Contrast  . CBC with Differential/Platelet  . TSH  . Comprehensive metabolic panel  . B12 and Folate Panel  . VITAMIN D 25 Hydroxy (Vit-D Deficiency, Fractures)  . Magnesium  . Ferritin  . Comprehensive metabolic panel  . Urinalysis Dipstick     Follow-up: No follow-ups on file.  An After Visit Summary was printed and given to the patient.  [name] [facility] [phone]

## 2020-01-18 ENCOUNTER — Other Ambulatory Visit: Payer: Self-pay | Admitting: Family Medicine

## 2020-01-18 ENCOUNTER — Institutional Professional Consult (permissible substitution): Payer: Medicare HMO | Admitting: Internal Medicine

## 2020-01-18 MED ORDER — CIPROFLOXACIN HCL 250 MG PO TABS: 250.0000 mg | ORAL_TABLET | Freq: Two times a day (BID) | ORAL | 0 refills | Status: DC

## 2020-01-27 ENCOUNTER — Encounter: Payer: Self-pay | Admitting: Family Medicine

## 2020-02-08 ENCOUNTER — Ambulatory Visit (INDEPENDENT_AMBULATORY_CARE_PROVIDER_SITE_OTHER): Payer: Medicare HMO | Admitting: Legal Medicine

## 2020-02-08 ENCOUNTER — Encounter: Payer: Self-pay | Admitting: Legal Medicine

## 2020-02-08 ENCOUNTER — Other Ambulatory Visit: Payer: Self-pay

## 2020-02-08 VITALS — BP 124/76 | HR 87 | Temp 97.3°F | Resp 16 | Wt 150.0 lb

## 2020-02-08 DIAGNOSIS — T7840XA Allergy, unspecified, initial encounter: Secondary | ICD-10-CM | POA: Diagnosis not present

## 2020-02-08 HISTORY — DX: Allergy, unspecified, initial encounter: T78.40XA

## 2020-02-08 MED ORDER — TRIAMCINOLONE ACETONIDE 40 MG/ML IJ SUSP
60.0000 mg | Freq: Once | INTRAMUSCULAR | Status: AC
  Administered 2020-02-08: 60 mg via INTRAMUSCULAR

## 2020-02-08 NOTE — Progress Notes (Signed)
Subjective:  Patient ID: Michele Parker, female    DOB: [birth year]  Age: 79 y.o. MRN: [id number]  Chief Complaint  Patient presents with  . Cough    HPI: follow up of chronic cough.  Dr. Tobie Poet did full workup on cough and did not find any thing.  They cancelled pulmonary visit due to beach trip.  She is using zyrtec. Cough worse for one week.  Sh eis having no UTI symptoms.   Current Outpatient Medications on File Prior to Visit  Medication Sig Dispense Refill  . benazepril-hydrochlorthiazide (LOTENSIN HCT) 20-12.5 MG tablet TAKE 1 TABLET TWICE DAILY 180 tablet 0  . Cholecalciferol (VITAMIN D3) 2000 UNITS TABS Take 1,000 Units by mouth.     . ciprofloxacin (CIPRO) 250 MG tablet Take 1 tablet (250 mg total) by mouth 2 (two) times daily. 14 tablet 0  . famotidine (PEPCID) 20 MG tablet     . levothyroxine (SYNTHROID) 50 MCG tablet Take 1 tablet (50 mcg total) by mouth daily before breakfast. 90 tablet 1  . memantine (NAMENDA) 5 MG tablet Take 1 tablet (5 mg total) by mouth 2 (two) times daily. 60 tablet 0  . Multiple Vitamin (MULTIVITAMIN) capsule Take 1 capsule by mouth daily.    . potassium chloride (MICRO-K) 10 MEQ CR capsule TAKE 1 CAPSULE EVERY DAY 90 capsule 1  . buPROPion (WELLBUTRIN XL) 150 MG 24 hr tablet Take 150 mg by mouth 2 (two) times daily.    [name] FLUoxetine (PROZAC) 10 MG capsule Take 10 mg by mouth 2 (two) times daily.    [name] OLANZapine (ZYPREXA) 5 MG tablet Take by mouth.     No current facility-administered medications on file prior to visit.   Past Medical History:  Diagnosis Date  . Angioedema   . Bradycardia   . Cervicalgia   . Depression   . Essential hypertension   . Hemochromatosis   . Hemorrhoid   . Hereditary hemochromatosis ([place])   . HTN (hypertension)   . Hypercalcemia   . Hyperlipidemia   . Hypothyroid   . Memory loss   . Osteopenia   . Osteopenia   . Other amnesia   . Other constipation   . Repeated falls   . Tremor   . Vitamin D deficiency     Past Surgical History:  Procedure Laterality Date  . CHOLECYSTECTOMY    . KNEE SURGERY    . MINOR HEMORRHOIDECTOMY    . SHOULDER SURGERY    . TUBAL LIGATION      Family History  Problem Relation Age of Onset  . Heart attack Mother   . Heart disease Mother   . CAD Brother   . Alcohol abuse Brother   . Other Father        unsure of medical hx - died in MVA when patient was 79 years old   Social History   Socioeconomic History  . Marital status: Married    Spouse name: Not on file  . Number of children: 2  . Years of education: 66  . Highest education level: High school graduate  Occupational History  . Occupation: Retired-hosiery  Tobacco Use  . Smoking status: Never Smoker  . Smokeless tobacco: Never Used  Vaping Use  . Vaping Use: Never used  Substance and Sexual Activity  . Alcohol use: No  . Drug use: No  . Sexual activity: Not on file  Other Topics Concern  . Not on file  Social History Narrative   Lives  at home with her husband.   Left-handed.   2 cups caffeine per day.   Social Determinants of Health   Financial Resource Strain:   . Difficulty of Paying Living Expenses: Not on file  Food Insecurity:   . Worried About Charity fundraiser in the Last Year: Not on file  . Ran Out of Food in the Last Year: Not on file  Transportation Needs:   . Lack of Transportation (Medical): Not on file  . Lack of Transportation (Non-Medical): Not on file  Physical Activity:   . Days of Exercise per Week: Not on file  . Minutes of Exercise per Session: Not on file  Stress:   . Feeling of Stress : Not on file  Social Connections:   . Frequency of Communication with Friends and Family: Not on file  . Frequency of Social Gatherings with Friends and Family: Not on file  . Attends Religious Services: Not on file  . Active Member of Clubs or Organizations: Not on file  . Attends Archivist Meetings: Not on file  . Marital Status: Not on file    Review of  Systems  Constitutional: Negative.   HENT: Positive for congestion.   Eyes: Negative.   Respiratory: Positive for cough.   Cardiovascular: Negative.   Genitourinary: Negative.   Neurological: Negative.      Objective:  BP 124/76   Pulse 87   Temp (!) 97.3 F (36.3 C)   Resp 16   Wt 150 lb (68 kg)   SpO2 95%   BMI 27.44 kg/m   BP/Weight 02/08/2020 3/61/4431 10/01/84  Systolic BP 761 950 99  Diastolic BP 76 58 60  Wt. (Lbs) 150 144.2 -  BMI 27.44 26.37 -    Physical Exam Vitals reviewed.  Constitutional:      Appearance: Normal appearance.  HENT:     Head: Normocephalic and atraumatic.     Right Ear: Tympanic membrane, ear canal and external ear normal.     Left Ear: Tympanic membrane, ear canal and external ear normal.     Nose: Nose normal.     Mouth/Throat:     Mouth: Mucous membranes are moist.  Eyes:     Conjunctiva/sclera: Conjunctivae normal.     Pupils: Pupils are equal, round, and reactive to light.  Cardiovascular:     Rate and Rhythm: Normal rate and regular rhythm.     Pulses: Normal pulses.     Heart sounds: Normal heart sounds.  Pulmonary:     Effort: Pulmonary effort is normal.     Breath sounds: Normal breath sounds.  Musculoskeletal:        General: Normal range of motion.     Cervical back: Normal range of motion and neck supple.  Skin:    Capillary Refill: Capillary refill takes less than 2 seconds.  Neurological:     Mental Status: She is alert.       Lab Results  Component Value Date   WBC 6.5 08/14/2019   HGB 14.2 08/14/2019   HCT 42.5 08/14/2019   PLT 239 08/14/2019   GLUCOSE 102 (H) 08/14/2019   CHOL 238 (H) 08/14/2019   TRIG 156 (H) 08/14/2019   HDL 70 08/14/2019   LDLCALC 141 (H) 08/14/2019   ALT 16 08/14/2019   AST 20 08/14/2019   NA 137 08/14/2019   K 4.5 08/14/2019   CL 100 08/14/2019   CREATININE 1.06 (H) 08/14/2019   BUN 22 08/14/2019   CO2  24 08/14/2019   TSH 3.380 08/14/2019      Assessment & Plan:    1. Allergy, initial encounter - triamcinolone acetonide (KENALOG-40) injection 60 mg Patient denies and UTI symptoms.  She has chronic cough with some allergic symptoms.  She also seems to have chronic vasomotor rhinitis.  She  Requests kenalog shot. She may need at nasal spray antihistamine  Meds ordered this encounter  Medications  . triamcinolone acetonide (KENALOG-40) injection 60 mg    No orders of the defined types were placed in this encounter.    Follow-up: Return if symptoms worsen or fail to improve.  An After Visit Summary was printed and given to the patient.  [place] [phone]

## 2020-02-09 ENCOUNTER — Other Ambulatory Visit: Payer: Self-pay | Admitting: Legal Medicine

## 2020-02-09 DIAGNOSIS — N3000 Acute cystitis without hematuria: Secondary | ICD-10-CM

## 2020-02-10 ENCOUNTER — Other Ambulatory Visit: Payer: Self-pay | Admitting: Legal Medicine

## 2020-02-10 ENCOUNTER — Other Ambulatory Visit: Payer: Self-pay | Admitting: Family Medicine

## 2020-03-24 DIAGNOSIS — F3341 Major depressive disorder, recurrent, in partial remission: Secondary | ICD-10-CM | POA: Diagnosis not present

## 2020-03-24 DIAGNOSIS — Z9889 Other specified postprocedural states: Secondary | ICD-10-CM | POA: Diagnosis not present

## 2020-03-24 DIAGNOSIS — F411 Generalized anxiety disorder: Secondary | ICD-10-CM | POA: Diagnosis not present

## 2020-03-24 DIAGNOSIS — F29 Unspecified psychosis not due to a substance or known physiological condition: Secondary | ICD-10-CM | POA: Diagnosis not present

## 2020-03-24 DIAGNOSIS — F0390 Unspecified dementia without behavioral disturbance: Secondary | ICD-10-CM | POA: Diagnosis not present

## 2020-04-01 ENCOUNTER — Telehealth: Payer: Self-pay | Admitting: Family Medicine

## 2020-04-01 NOTE — Chronic Care Management (AMB) (Signed)
  Chronic Care Management   Note  04/01/2020 Name: Michele Parker MRN: [id number] DOB: 1940-12-25  Michele Parker is a 79 y.o. year old female who is a primary care patient of [name], [name], MD. I reached out to [name] by phone today in response to a referral sent by [name] PCP, [name], MD.   [name] was given information about Chronic Care Management services today including:  1. CCM service includes personalized support from designated clinical staff supervised by her physician, including individualized plan of care and coordination with other care providers 2. 24/7 contact phone numbers for assistance for urgent and routine care needs. 3. Service will only be billed when office clinical staff spend 20 minutes or more in a month to coordinate care. 4. Only one practitioner may furnish and bill the service in a calendar month. 5. The patient may stop CCM services at any time (effective at the end of the month) by phone call to the office staff.   [name] verbally agreed to assistance and services provided by embedded care coordination/care management team today.  Follow up plan:  [place]

## 2020-04-03 ENCOUNTER — Other Ambulatory Visit: Payer: Self-pay

## 2020-04-03 MED ORDER — BENAZEPRIL-HYDROCHLOROTHIAZIDE 20-12.5 MG PO TABS: 1.0000 | ORAL_TABLET | Freq: Two times a day (BID) | ORAL | 0 refills | Status: DC

## 2020-05-01 DIAGNOSIS — F3341 Major depressive disorder, recurrent, in partial remission: Secondary | ICD-10-CM | POA: Diagnosis not present

## 2020-05-01 DIAGNOSIS — F411 Generalized anxiety disorder: Secondary | ICD-10-CM | POA: Diagnosis not present

## 2020-05-01 DIAGNOSIS — Z79899 Other long term (current) drug therapy: Secondary | ICD-10-CM | POA: Diagnosis not present

## 2020-05-01 DIAGNOSIS — Z9889 Other specified postprocedural states: Secondary | ICD-10-CM | POA: Diagnosis not present

## 2020-05-01 DIAGNOSIS — F0390 Unspecified dementia without behavioral disturbance: Secondary | ICD-10-CM | POA: Diagnosis not present

## 2020-05-02 ENCOUNTER — Telehealth: Payer: Self-pay

## 2020-05-02 NOTE — Chronic Care Management (AMB) (Signed)
Chronic Care Management Pharmacy Assistant   Name: Michele Parker  MRN: [id number] DOB: Jul 13, 1940  Reason for Encounter: Medication Review    Patient Questions: Have you seen any other providers since your last visit? 03/24/2020- Dr Boyce Medici (Psychiatry), 05/01/2020- Dr. Boyce Medici (Psychiatry).  Any changes in your medications or health? Bupropion was changed to 150 mg from 300 mg 1 tablet daily by Dr Boyce Medici. Possible Urinary Tract Infection, patient is seeing PCP on 05/05/20.  Any side effects from any medications? No medication side effects.  Do you have an symptoms or problems not managed by your medications? Unsteady gait and increased confusion that daughter doesn't think is being managed by medicatons.  Any concerns about your health right now? Decreased activity, exercise concerns, increased confusion.  Has your provider asked that you check blood pressure, blood sugar, or follow special diet at home? No special diet, blood pressure is checked as needed, does not need to check blood sugars but daughter does watch her daily sugar intake.  Do you get any type of exercise on a regular basis? No- daughter would like to discuss in-home Physical Therapy.   Can you think of a goal you would like to reach for your health? Increased mobility.  Do you have any problems getting your medications? No Is there anything that you would like to discuss during the appointment? Increased confusion, getting in-home Physical Therapy.  Patient's daughter aware to have medication and supplements along with any blood pressure reading near during telephone appointment.   PCP : [name], MD  Allergies:   Allergies  Allergen Reactions  . Bystolic [Nebivolol Hcl] Other (See Comments)    Bradycardia  . Aricept [Donepezil]     Nausea and vomiting.  . Lisinopril Swelling  . Other     Other reaction(s): GI Upset (intolerance)  . Tylox [Oxycodone-Acetaminophen]      Medications: Outpatient Encounter Medications as of 05/02/2020  Medication Sig  . benazepril-hydrochlorthiazide (LOTENSIN HCT) 20-12.5 MG tablet Take 1 tablet by mouth 2 (two) times daily.  [name] buPROPion (WELLBUTRIN XL) 150 MG 24 hr tablet Take 150 mg by mouth 2 (two) times daily.  . Cholecalciferol (VITAMIN D3) 2000 UNITS TABS Take 1,000 Units by mouth.   . ciprofloxacin (CIPRO) 250 MG tablet Take 1 tablet (250 mg total) by mouth 2 (two) times daily.  . famotidine (PEPCID) 20 MG tablet   . FLUoxetine (PROZAC) 10 MG capsule Take 10 mg by mouth 2 (two) times daily.  [name] levothyroxine (SYNTHROID) 50 MCG tablet Take 1 tablet (50 mcg total) by mouth daily before breakfast.  . memantine (NAMENDA) 5 MG tablet TAKE 1 TABLET BY MOUTH TWICE A DAY  . Multiple Vitamin (MULTIVITAMIN) capsule Take 1 capsule by mouth daily.  [name] OLANZapine (ZYPREXA) 5 MG tablet Take by mouth.  . potassium chloride (MICRO-K) 10 MEQ CR capsule TAKE 1 CAPSULE EVERY DAY   No facility-administered encounter medications on file as of 05/02/2020.    Current Diagnosis: Patient Active Problem List   Diagnosis Date Noted  . Allergies 02/08/2020  . Primary hemochromatosis ([place]) 07/30/2019  . Mild cognitive disorder 09/27/2017  . Gait abnormality 09/27/2017  . Tremor 09/27/2017  . Arthritis 06/09/2016  . Cognitive impairment 09/27/2013  . MDD (major depressive disorder), recurrent, in partial remission ([place]) 09/27/2013  . Severe major depression with psychotic features ([place]) 09/27/2013  . Psychosis ([place]) 09/06/2013  . HTN (hypertension) 07/20/2013  . Hyperlipidemia 07/20/2013  . Palpitations 07/20/2013     Follow-Up:  Pharmacist Review  Clarita Leber, CPA spoke with daughter, reviewed initial questions, Donette Larry, CPP notified.  Pattricia Boss, [place] Pharmacist Assistant [phone]

## 2020-05-02 NOTE — Telephone Encounter (Signed)
[name] called to report that her Mother was seen by psychiatry yesterday and she has been experiencing more confusion.  She has no fever or chills and she has no UTI symptoms.  She did have labs drawn yesterday but no urine.  Symptoms discussed with Dr. Tobie Poet.  She advised follow-up in the Urgent Care or ED if symptoms worsen.  She was scheduled to be seen in the office Monday morning.

## 2020-05-02 NOTE — Chronic Care Management (AMB) (Signed)
Chronic Care Management Pharmacy  Name: Michele Parker  MRN: [id number] DOB: 10-Feb-1941  Chief Complaint/ HPI  Michele Parker,  79 y.o. , female presents for their Initial CCM visit with the clinical pharmacist via telephone due to COVID-19 Pandemic.  PCP : Michele Brome, MD  Their chronic conditions include: hypertension, arthritis, hyperlipidemia, major depressive disorder, primary hemochromatosis, psychosis, mild cognitive disorder.   Office Visits: 05/05/2020 - uti - cipro 250 mg bid x7 days. Flu shot given in office. Recommend start antihistamines for allergies.  02/08/2020 - allergies - kenalog. Recommend nasal spray.  01/17/2020 - increase Namenda 5 mg bid. Labs drawn. Head CT.  Consult Visit: 05/01/2020 - psych - olanzapine 5 mg nightly. Encourage neurology.  03/24/2020 - Psych - encourage neurology referral to rule out parkinson's. Option of decreasing olanzapine 2.5 mg ever other night.  12/20/2019 - Psych - continue current medicaitons. Try memantine 5 mg nightly.  Medications: Outpatient Encounter Medications as of 05/07/2020  Medication Sig  . benazepril-hydrochlorthiazide (LOTENSIN HCT) 20-12.5 MG tablet Take 1 tablet by mouth 2 (two) times daily.  . benzonatate (TESSALON) 100 MG capsule Take 1 capsule (100 mg total) by mouth 2 (two) times daily as needed for cough.  [name] buPROPion (WELLBUTRIN XL) 150 MG 24 hr tablet Take 150 mg by mouth in the morning.   . cetirizine (ZYRTEC) 10 MG tablet Take 10 mg by mouth daily.  . Cholecalciferol (VITAMIN D3) 25 MCG (1000 UT) CAPS Take 1,000 Units by mouth.   . ciprofloxacin (CIPRO) 250 MG tablet Take 1 tablet (250 mg total) by mouth 2 (two) times daily.  . famotidine (PEPCID) 20 MG tablet Take 20 mg by mouth daily as needed.   [name] FLUoxetine (PROZAC) 10 MG capsule Take 20 mg by mouth daily.   Michele Parker Oil 1000 MG CAPS Take 1 capsule by mouth daily.  [name] levothyroxine (SYNTHROID) 50 MCG tablet Take 1 tablet (50 mcg total) by mouth daily  before breakfast.  . Menaquinone-7 (VITAMIN K2 PO) Take 1 tablet by mouth daily.  . Multiple Vitamin (MULTIVITAMIN) capsule Take 1 capsule by mouth daily.  [name] OLANZapine (ZYPREXA) 5 MG tablet Take 5 mg by mouth at bedtime.   . potassium chloride (MICRO-K) 10 MEQ CR capsule Take 1 capsule (10 mEq total) by mouth daily.  . [DISCONTINUED] ciprofloxacin (CIPRO) 250 MG tablet Take 1 tablet (250 mg total) by mouth 2 (two) times daily.  . [DISCONTINUED] memantine (NAMENDA) 5 MG tablet TAKE 1 TABLET BY MOUTH TWICE A DAY  . [DISCONTINUED] potassium chloride (MICRO-K) 10 MEQ CR capsule TAKE 1 CAPSULE EVERY DAY   No facility-administered encounter medications on file as of 05/07/2020.   Allergies  Allergen Reactions  . Bystolic [Nebivolol Hcl] Other (See Comments)    Bradycardia  . Aricept [Donepezil]     Nausea and vomiting.  . Lisinopril Swelling  . Other     Other reaction(s): GI Upset (intolerance)  . Oxycodone     rash  . SDOH Screenings   Alcohol Screen: Not on file  Depression (PHQ2-9): Low Risk   . PHQ-2 Score: 3  Financial Resource Strain: Not on file  Food Insecurity: No Food Insecurity  . Worried About Charity fundraiser in the Last Year: Never true  . Ran Out of Food in the Last Year: Never true  Housing: Low Risk   . Last Housing Risk Score: 0  Physical Activity: Not on file  Social Connections: Not on file  Stress: Not on  file  Tobacco Use: Low Risk   . Smoking Tobacco Use: Never Smoker  . Smokeless Tobacco Use: Never Used  Transportation Needs: No Transportation Needs  . Lack of Transportation (Medical): No  . Lack of Transportation (Non-Medical): No    Current Diagnosis/Assessment:  Goals Addressed            This Visit's Progress   . Pharmacy Care Plan       CARE PLAN ENTRY (see longitudinal plan of care for additional care plan information)  Current Barriers:  . Chronic Disease Management support, education, and care coordination needs related to  Hypertension, Hyperlipidemia, and Depression   Hypertension BP Readings from Last 3 Encounters:  05/05/20 116/60  02/08/20 124/76  01/17/20 (!) 110/58   . Pharmacist Clinical Goal(s): o Over the next 90 days, patient will work with PharmD and providers to maintain BP goal <130/80 . Current regimen:  o Benazepril-hydrochlorothiazide 20-12.5 mg bid  . Interventions: o Discussed home health PT for improved mobility and fall prevention.  o Reviewed diet and discussed benefits of healthy diet.  . Patient self care activities - Over the next 90 days, patient will: o Check BP monthly, document, and provide at future appointments o Ensure daily salt intake < 2300 mg/day  Hyperlipidemia Lab Results  Component Value Date/Time   LDLCALC 141 (H) 08/14/2019 11:36 AM   . Pharmacist Clinical Goal(s): o Over the next 90 days, patient will work with PharmD and providers to achieve LDL goal < 100 . Current regimen:  o Diet and lifestyle . Interventions: o Discussed patient's last lipid panel was non-fasting.  o Reviewed importance of lean meat, vegetables and fruit. Patient eats a variety of fruits and vegetables.  o Patient establishing care with home health physical therapy to improve strength, balance and fall prevention.  . Patient self care activities - Over the next 90 days, patient will: o Work with home health pt to improve walking and muscle tone.  o Continue eating healthy diet.   Mild Cognitive Disorder . Pharmacist Clinical Goal(s) o Over the next 90 days, patient will work with PharmD and providers to see neurology for management.  . Current regimen:  o Nothing currently.  . Interventions: o Discussed both side effects and lack of efficacy of past therapy.  . Patient self care activities - Over the next 90 days, patient will: o See neurology for evaluation and plan.   Medication management . Pharmacist Clinical Goal(s): o Over the next 90 days, patient will work with PharmD  and providers to maintain optimal medication adherence . Current pharmacy: [facility] mail order . Interventions o Comprehensive medication review performed. o Continue current medication management strategy . Patient self care activities - Over the next 90 days, patient will: o Focus on medication adherence by using pill box o Take medications as prescribed o Report any questions or concerns to PharmD and/or provider(s)  Initial goal documentation        Hypertension   Office blood pressures are  BP Readings from Last 3 Encounters:  05/05/20 116/60  02/08/20 124/76  01/17/20 (!) 110/58   Lab Results  Component Value Date   CREATININE 1.06 (H) 08/14/2019   BUN 22 08/14/2019   GFR 89.64 07/20/2013   GFRNONAA 49 (L) 08/14/2019   GFRAA 57 (L) 08/14/2019   NA 137 08/14/2019   K 4.5 08/14/2019   CALCIUM 10.1 08/14/2019   CO2 24 08/14/2019     Patient has failed these meds in the past:  amlodipine, hydrochlorothiazide, olmesartan Patient is currently controlled on the following medications:   Benazepril-hydrochlorothiazide 20-12.5 mg bid Patient checks BP at home several times per month  Patient home BP readings are ranging: well controlled  We discussed diet and exercise extensively   Exercise: Patient sits watching tv a lot during the day. Isn't interested in exercise or going outdoors. Daughter states she is unsteady on her feet. Daughter states that patient is very weak and often doesn't pick up her feet well when walking. She has had a few falls separate from lower blood pressure incidents. Daughter is hoping PT will help improve balance and muscle strength.   Diet: Eats cereal, fruit, eggs, grits toast for breakfast. Patient has a decent appetite. She occasionally will skip lunch. She will eat leftovers or peanut butter or chicken salad sandwich. Daughter cooks supper and patient has a good appetite. Patient likes vegetables.   Daughter indicates patient had 8 falls  before changing back to benazepril for blood pressure control.   Plan  Continue current medications     Hyperlipidemia   LDL goal < 100  Last lipids Lab Results  Component Value Date   CHOL 238 (H) 08/14/2019   HDL 70 08/14/2019   LDLCALC 141 (H) 08/14/2019   TRIG 156 (H) 08/14/2019   CHOLHDL 3.4 08/14/2019   Hepatic Function Latest Ref Rng & Units 08/14/2019  Total Protein 6.0 - 8.5 g/dL 6.3  Albumin 3.6 - 4.6 g/dL 4.5  AST 0 - 40 IU/L 20  ALT 0 - 32 IU/L 16  Alk Phosphatase 39 - 117 IU/L 89  Total Bilirubin 0.0 - 1.2 mg/dL 0.5     The ASCVD Risk score ([place]., et al., 2013) failed to calculate for the following reasons:   The 2013 ASCVD risk score is only valid for ages 78 to 53   Patient has failed these meds in past: red yeast rice Patient is currently controlled on the following medications:  . diet and lifestyle  We discussed:  diet and exercise extensively. Patient's last lipid panel was non-fasting. Patient eats a variety of fruits and vegetables. Daughter works to make sure patient has healthy options.   Plan  Continue control with diet and exercise. Pharmacist will review updated lipid panel and evaluate after next visit with Dr. Tobie Poet.   Hypothyroidism   Lab Results  Component Value Date/Time   TSH 2.490 05/05/2020 11:42 AM   TSH 3.380 08/14/2019 11:36 AM   FREET4 1.52 05/05/2020 11:42 AM    Patient has failed these meds in past: none reported Patient is currently controlled on the following medications:  . Synthroid 50 mcg daily before breakfast  We discussed:  Patient takes brand name Synthroid from a discount mail order pharmacy. Denies symptoms/concerns at this time. Recent TSH in range.   Plan  Continue current medications   Mild Cognitive Disorder   MMSE - Mini Mental State Exam 05/05/2020 09/27/2017  Orientation to time 4 5  Orientation to Place 5 5  Registration 3 3  Attention/ Calculation 5 5  Recall 0 2  Language- name 2  objects 2 2  Language- repeat 1 1  Language- follow 3 step command 3 3  Language- read & follow direction 1 1  Write a sentence 1 1  Copy design 1 1  Total score 26 29     Patient has failed these meds in past: donepezil (violent nausea/vomiting), memantine (no improvement) Patient is currently uncontrolled on the following medications: nothing currently  We discussed: Patient lives with daughter and no longer drives. She doesn't remember small details on normal days but had some hallucination activity this week before being diagnosed with UTI.  Patient's tremors are worsening. Patient has had some falls at home. Daughter would like to monitor patient's sleeping patterns on camera but patient is refusing.   Patient's memantine was discontinued due to lack of improvement. Daughter is working to set her up with neurology to be rule out parkinson's.  Plan  Schedule visit with neurology.   Severe Major Depression with Psychotic features - Managed by Psych   Depression screen [facility] 2/9 05/07/2020  Decreased Interest 1  Down, Depressed, Hopeless 1  PHQ - 2 Score 2  Altered sleeping 0  Tired, decreased energy 1  Change in appetite 0  Feeling bad or failure about yourself  0  Trouble concentrating 0  Moving slowly or fidgety/restless 0  Suicidal thoughts 0  PHQ-9 Score 3    Patient has failed these meds in past: lorazepam, sertraline  Patient is currently controlled on the following medications:  . Olanzapine 5 mg daily at bedtime . Fluoxetine 20 mg daily  . Bupropron XL 150 mg daily in the morning  We discussed:  Patient has been seen by many specialists over the years for history of depression with psychotic features. She had ECT therapy inpatient. She follows-up with Dr. Boyce Medici now. Daughter is concerned that medications are masking her mom's true personality. Patient also has tremor that could be coming from side effect of medication but psychiatry would like neurology referral  to assess for possible parkinson's. Psychiatry and daughter are concerned to reduce medications at this time.   Patient has had significant depression with psychotic issues since 2014. Patient's husband passed away 2018-11-07 from heart attack. She began living with daughter after that time.   Plan  Continue current medications. Schedule visit with Neurology. Continue following up with psychiatry.    Osteopenia / Osteoporosis   Last DEXA Scan: 05/08/2018  T-Score femoral neck: -2.4  T-Score lumbar spine: -1.2  10-year probability of major osteoporotic fracture: 26.3%  10-year probability of hip fracture: 8.2%  No results found for: VD25OH - patient's vitamin D was at a normal/mid range per daughter at recent psychiatry visit.    Patient is a candidate for pharmacologic treatment due to T-Score -1.0 to -2.5 and 10-year risk of major osteoporotic fracture > 20%  Patient has failed these meds in past: Calcium carbonate with D  Patient is currently controlled on the following medications:  [name] Vitamin D 1000 units daily  . K2 45 mcg daily   We discussed:  Recommend [phone] units of vitamin D daily. Recommend 1200 mg of calcium daily from dietary and supplemental sources. Recommend weight-bearing and muscle strengthening exercises for building and maintaining bone density.  Diet: Eats broccoli, greens, and sources of calcium in diet regularly. Eats cheese regularly. Drinks milk with cereal.   Exercise: Patient needs to be walking but patient is unsteady and weak. Daughter is trying to encourage activity. Hoping PT will improve stability and strength.   Daughter would like to hold off on adding prescription for bones at this time. Discussed risks of fracture and last Dexa results. Daughter indicates that patient was advised to stop calcium in the past due to high level.   Patient due to for updated Dexa but daughter would like to defer until after neurology visit.    Plan  Continue  current medications  Health Maintenance  Patient is currently controlled on the following medications:  . famotidine 20 mg daily prn - indigestion/reflux . Potassium 10 meq daily - supplementation** . Multivitamin daily - supplementation . Krill oil daily - supplementation . Cetirizine 10 mg daily - allergies  We discussed:  Discussed trying loratadine instead since patient doesn't seem to be responding as well lately. Patient's daughter indicates they will also try OTC nasal spray to help with allergy symptoms.   Plan  Continue current medications  Vaccines   Reviewed and discussed patient's vaccination history. Not interested in COVID vaccine. Had a mild case of shingles. Had Zostavax years ago. Patient has not had Shingrix shot per daughter report.  Immunization History  Administered Date(s) Administered  . Fluad Quad(high Dose 65+) 05/05/2020  . Influenza-Unspecified 04/05/2019  . Pneumococcal Conjugate-13 04/29/2014  . Pneumococcal Polysaccharide-23 05/04/2007  . Tdap 04/18/2012    Plan  Recommended patient receive Shingrix vaccine office.   Medication Management   Patient's preferred pharmacy is:  [facility], [place] [address] Phone: [phone] Fax: [phone]  [facility] - [place], [place] - [address] [place] [address] Phone: [phone] Fax: [place], [place] [address] Phone: [phone] Fax: [phone]  Uses pill box? Yes Pt endorses good compliance - daughter helps with medications.   We discussed: Current pharmacy is preferred with insurance plan and patient is satisfied with pharmacy services  Plan  Continue current medication management strategy    Follow up: 6 month phone visit

## 2020-05-05 ENCOUNTER — Other Ambulatory Visit: Payer: Self-pay

## 2020-05-05 ENCOUNTER — Encounter: Payer: Self-pay | Admitting: Family Medicine

## 2020-05-05 ENCOUNTER — Ambulatory Visit (INDEPENDENT_AMBULATORY_CARE_PROVIDER_SITE_OTHER): Payer: Medicare HMO | Admitting: Family Medicine

## 2020-05-05 VITALS — BP 116/60 | HR 88 | Temp 96.4°F | Resp 18 | Ht 61.0 in | Wt 145.2 lb

## 2020-05-05 DIAGNOSIS — E038 Other specified hypothyroidism: Secondary | ICD-10-CM

## 2020-05-05 DIAGNOSIS — R251 Tremor, unspecified: Secondary | ICD-10-CM

## 2020-05-05 DIAGNOSIS — Z23 Encounter for immunization: Secondary | ICD-10-CM

## 2020-05-05 DIAGNOSIS — R410 Disorientation, unspecified: Secondary | ICD-10-CM

## 2020-05-05 DIAGNOSIS — R531 Weakness: Secondary | ICD-10-CM | POA: Diagnosis not present

## 2020-05-05 DIAGNOSIS — N3000 Acute cystitis without hematuria: Secondary | ICD-10-CM | POA: Diagnosis not present

## 2020-05-05 DIAGNOSIS — R059 Cough, unspecified: Secondary | ICD-10-CM

## 2020-05-05 LAB — POCT URINALYSIS DIPSTICK
Bilirubin, UA: NEGATIVE
Blood, UA: POSITIVE
Glucose, UA: NEGATIVE
Ketones, UA: NEGATIVE
Nitrite, UA: NEGATIVE
Protein, UA: POSITIVE — AB
Spec Grav, UA: 1.02 (ref 1.010–1.025)
Urobilinogen, UA: NEGATIVE E.U./dL — AB
pH, UA: 6 (ref 5.0–8.0)

## 2020-05-05 MED ORDER — CIPROFLOXACIN HCL 250 MG PO TABS: 250.0000 mg | ORAL_TABLET | Freq: Two times a day (BID) | ORAL | 0 refills | Status: DC

## 2020-05-05 NOTE — Patient Instructions (Signed)
Agree with neurology referral Bladder infection: Given Cipro 250 mg twice daily for 1 week. Confusion: Checking labs Hypothyroidism: Checking labs Weakness/gait abnormalities: Refer to home health care for physical therapy.  Patient will benefit from a CNA. Given Fluad vaccination today.

## 2020-05-05 NOTE — Progress Notes (Signed)
Subjective:  Patient ID: Michele Parker, female    DOB: 04/19/1941  Age: 79 y.o. MRN: [id number]  Chief Complaint  Patient presents with  . Altered Mental Status  . Tremors    HPI  Patient saw Dr. Clemon Chambers, psychiatry. Pt saw Dr. Krista Blue previously. Her daughter, did not particularly like Dr. Krista Blue.  Dr. Clemon Chambers recommended she return back to neurology. More confused. Gait: Pt is shuffling feet with knees bent. Example; Asked her daughter what she was going to wear to MAWMAW's house, who has passed few years ago. Recognizes family members. No wandering off.  Denies dysuria/hematuria/frequency. In the past when she becomes confused, she has had a bladder infection.  Complaining of cough, congestion, runny nose. Allergies flare up about twice a year.   Current Outpatient Medications on File Prior to Visit  Medication Sig Dispense Refill  . benazepril-hydrochlorthiazide (LOTENSIN HCT) 20-12.5 MG tablet Take 1 tablet by mouth 2 (two) times daily. 180 tablet 0  . buPROPion (WELLBUTRIN XL) 150 MG 24 hr tablet Take 150 mg by mouth daily.     . Cholecalciferol (VITAMIN D3) 2000 UNITS TABS Take 1,000 Units by mouth.     . famotidine (PEPCID) 20 MG tablet     . FLUoxetine (PROZAC) 10 MG capsule Take 20 mg by mouth daily.     [name] levothyroxine (SYNTHROID) 50 MCG tablet Take 1 tablet (50 mcg total) by mouth daily before breakfast. 90 tablet 1  . Multiple Vitamin (MULTIVITAMIN) capsule Take 1 capsule by mouth daily.    . potassium chloride (MICRO-K) 10 MEQ CR capsule TAKE 1 CAPSULE EVERY DAY 90 capsule 1  . OLANZapine (ZYPREXA) 5 MG tablet Take by mouth.     No current facility-administered medications on file prior to visit.   Past Medical History:  Diagnosis Date  . Angioedema   . Bradycardia   . Cervicalgia   . Depression   . Essential hypertension   . Hemochromatosis   . Hemorrhoid   . Hereditary hemochromatosis ([place])   . HTN (hypertension)   . Hypercalcemia   . Hyperlipidemia   .  Hypothyroid   . Memory loss   . Osteopenia   . Osteopenia   . Other amnesia   . Other constipation   . Repeated falls   . Tremor   . Vitamin D deficiency    Past Surgical History:  Procedure Laterality Date  . CHOLECYSTECTOMY    . KNEE SURGERY    . MINOR HEMORRHOIDECTOMY    . SHOULDER SURGERY    . TUBAL LIGATION      Family History  Problem Relation Age of Onset  . Heart attack Mother   . Heart disease Mother   . CAD Brother   . Alcohol abuse Brother   . Other Father        unsure of medical hx - died in MVA when patient was 79 years old   Social History   Socioeconomic History  . Marital status: Married    Spouse name: Not on file  . Number of children: 2  . Years of education: 38  . Highest education level: High school graduate  Occupational History  . Occupation: Retired-hosiery  Tobacco Use  . Smoking status: Never Smoker  . Smokeless tobacco: Never Used  Vaping Use  . Vaping Use: Never used  Substance and Sexual Activity  . Alcohol use: No  . Drug use: No  . Sexual activity: Not on file  Other Topics Concern  . Not  on file  Social History Narrative   Lives at home with her husband.   Left-handed.   2 cups caffeine per day.   Social Determinants of Health   Financial Resource Strain:   . Difficulty of Paying Living Expenses: Not on file  Food Insecurity:   . Worried About Charity fundraiser in the Last Year: Not on file  . Ran Out of Food in the Last Year: Not on file  Transportation Needs:   . Lack of Transportation (Medical): Not on file  . Lack of Transportation (Non-Medical): Not on file  Physical Activity:   . Days of Exercise per Week: Not on file  . Minutes of Exercise per Session: Not on file  Stress:   . Feeling of Stress : Not on file  Social Connections:   . Frequency of Communication with Friends and Family: Not on file  . Frequency of Social Gatherings with Friends and Family: Not on file  . Attends Religious Services: Not on  file  . Active Member of Clubs or Organizations: Not on file  . Attends Archivist Meetings: Not on file  . Marital Status: Not on file    Review of Systems  Constitutional: Negative for chills, fatigue and fever.  HENT: Negative for congestion, rhinorrhea and sore throat.   Respiratory: Positive for cough (twice a yr. Sinus drainage. ). Negative for shortness of breath.   Cardiovascular: Negative for chest pain.  Gastrointestinal: Negative for abdominal pain, constipation, diarrhea, nausea and vomiting.  Genitourinary: Negative for dysuria and urgency.       Incontinence: pt hides it.   Musculoskeletal: Positive for back pain. Negative for myalgias.  Neurological: Negative for dizziness, weakness, light-headedness and headaches.  Psychiatric/Behavioral: Positive for confusion. Negative for dysphoric mood. The patient is not nervous/anxious.   Almost fell last Wednesday. Fallen twice in the last 6 months. No injuries. Daughter would like her mother to get PT at home for weakness and balance and possibly a walker.    Objective:  BP 116/60   Pulse 88   Temp (!) 96.4 F (35.8 C)   Resp 18   Ht 5\' 1"  (1.549 m)   Wt 145 lb 3.2 oz (65.9 kg)   BMI 27.44 kg/m   BP/Weight 05/05/2020 02/08/2020 3/81/8299  Systolic BP 371 696 789  Diastolic BP 60 76 58  Wt. (Lbs) 145.2 150 144.2  BMI 27.44 27.44 26.37    Physical Exam Vitals reviewed.  Constitutional:      General: She is not in acute distress.    Appearance: Normal appearance. She is obese.  HENT:     Right Ear: Tympanic membrane and ear canal normal.     Left Ear: Tympanic membrane and ear canal normal.     Nose: Congestion and rhinorrhea present.  Eyes:     Conjunctiva/sclera: Conjunctivae normal.  Neck:     Thyroid: No thyroid mass.     Vascular: No carotid bruit.  Cardiovascular:     Rate and Rhythm: Normal rate and regular rhythm.     Heart sounds: No murmur heard.   Pulmonary:     Effort: Pulmonary effort  is normal.     Breath sounds: Normal breath sounds.  Abdominal:     General: Bowel sounds are normal.     Palpations: Abdomen is soft. There is no mass.     Tenderness: There is no abdominal tenderness.  Neurological:     Mental Status: She is alert.  Cranial Nerves: No cranial nerve deficit.     Motor: No weakness (able to get up with out using her hands).     Coordination: Coordination abnormal (tremor Bilaterally. Positive dysmetria. ).     Gait: Gait abnormal.  Psychiatric:        Mood and Affect: Mood normal.        Behavior: Behavior normal.   Strength 5/5 UE and LE. Slightly shuffling gait. Negative rhombergs.  MMSE 26/30.  Lab Results  Component Value Date   WBC 6.5 08/14/2019   HGB 14.2 08/14/2019   HCT 42.5 08/14/2019   PLT 239 08/14/2019   GLUCOSE 102 (H) 08/14/2019   CHOL 238 (H) 08/14/2019   TRIG 156 (H) 08/14/2019   HDL 70 08/14/2019   LDLCALC 141 (H) 08/14/2019   ALT 16 08/14/2019   AST 20 08/14/2019   NA 137 08/14/2019   K 4.5 08/14/2019   CL 100 08/14/2019   CREATININE 1.06 (H) 08/14/2019   BUN 22 08/14/2019   CO2 24 08/14/2019   TSH 3.380 08/14/2019      Assessment & Plan:   1. Confusion - Likely due to UTI.  - Ambulatory referral to home health  Patient will benefit from PT and CNA. - B12 and Folate Panel - Methylmalonic acid, serum  2. Acute cystitis without hematuria Stat on cipro 250 mg one twice a day x 7 days.  - POCT urinalysis dipstick positive for bladder infection. - Urine Culture  3. Other specified hypothyroidism - TSH - T4, Free  4. Weakness - Ambulatory referral to Home Health  5. Cough Due to allergies. Recommend start antihistamines.   6. Tremor Worsened. Agree with neurology referral from psychiatry.   7. Need for immunization against influenza - Flu Vaccine QUAD High Dose(Fluad)    Meds ordered this encounter  Medications  . ciprofloxacin (CIPRO) 250 MG tablet    Sig: Take 1 tablet (250 mg total) by  mouth 2 (two) times daily.    Dispense:  14 tablet    Refill:  0    Orders Placed This Encounter  Procedures  . Urine Culture  . Flu Vaccine QUAD High Dose(Fluad)  . B12 and Folate Panel  . Methylmalonic acid, serum  . TSH  . T4, Free  . Ambulatory referral to Home Health  . POCT urinalysis dipstick      I spent 30 minutes dedicated to the care of this patient on the date of this encounter to include face-to-face time with the patient, as well as: review of Dr Bard Herbert labs and charts. MMSE. Thorough neuro exam. Agree with neurology referral.  Follow-up: Return in about 3 months (around 08/03/2020) for fasting visit.  An After Visit Summary was printed and given to the patient.  [name], MD [name] [phone]

## 2020-05-06 ENCOUNTER — Other Ambulatory Visit: Payer: Self-pay

## 2020-05-06 LAB — TSH: TSH: 2.49 u[IU]/mL (ref 0.450–4.500)

## 2020-05-06 LAB — T4, FREE: Free T4: 1.52 ng/dL (ref 0.82–1.77)

## 2020-05-06 MED ORDER — BENZONATATE 100 MG PO CAPS: 100.0000 mg | ORAL_CAPSULE | Freq: Two times a day (BID) | ORAL | 0 refills | Status: DC | PRN

## 2020-05-06 MED ORDER — POTASSIUM CHLORIDE ER 10 MEQ PO CPCR
10.0000 meq | ORAL_CAPSULE | Freq: Every day | ORAL | 1 refills | Status: DC
Start: 2020-05-06 — End: 2020-10-22

## 2020-05-07 ENCOUNTER — Ambulatory Visit: Payer: Medicare HMO

## 2020-05-07 ENCOUNTER — Other Ambulatory Visit: Payer: Self-pay

## 2020-05-07 DIAGNOSIS — I10 Essential (primary) hypertension: Secondary | ICD-10-CM

## 2020-05-07 DIAGNOSIS — E782 Mixed hyperlipidemia: Secondary | ICD-10-CM

## 2020-05-07 LAB — URINE CULTURE: Organism ID, Bacteria: NO GROWTH

## 2020-05-09 LAB — B12 AND FOLATE PANEL
Folate: 11.2 ng/mL (ref >3.0)
Vitamin B-12: 600 pg/mL (ref 232–1245)

## 2020-05-09 LAB — METHYLMALONIC ACID, SERUM: Methylmalonic Acid: 187 nmol/L (ref 0–378)

## 2020-05-09 NOTE — Patient Instructions (Addendum)
Visit Information  Thank you for your time discussing your medications. I look forward to working with you to achieve your health care goals. Below is a summary of what we talked about during our visit.   Goals Addressed            This Visit's Progress   . Pharmacy Care Plan       CARE PLAN ENTRY (see longitudinal plan of care for additional care plan information)  Current Barriers:  . Chronic Disease Management support, education, and care coordination needs related to Hypertension, Hyperlipidemia, and Depression   Hypertension BP Readings from Last 3 Encounters:  05/05/20 116/60  02/08/20 124/76  01/17/20 (!) 110/58   . Pharmacist Clinical Goal(s): o Over the next 90 days, patient will work with PharmD and providers to maintain BP goal <130/80 . Current regimen:  o Benazepril-hydrochlorothiazide 20-12.5 mg bid  . Interventions: o Discussed home health PT for improved mobility and fall prevention.  o Reviewed diet and discussed benefits of healthy diet.  . Patient self care activities - Over the next 90 days, patient will: o Check BP monthly, document, and provide at future appointments o Ensure daily salt intake < 2300 mg/day  Hyperlipidemia Lab Results  Component Value Date/Time   LDLCALC 141 (H) 08/14/2019 11:36 AM   . Pharmacist Clinical Goal(s): o Over the next 90 days, patient will work with PharmD and providers to achieve LDL goal < 100 . Current regimen:  o Diet and lifestyle . Interventions: o Discussed patient's last lipid panel was non-fasting.  o Reviewed importance of lean meat, vegetables and fruit. Patient eats a variety of fruits and vegetables.  o Patient establishing care with home health physical therapy to improve strength, balance and fall prevention.  . Patient self care activities - Over the next 90 days, patient will: o Work with home health pt to improve walking and muscle tone.  o Continue eating healthy diet.   Mild Cognitive  Disorder . Pharmacist Clinical Goal(s) o Over the next 90 days, patient will work with PharmD and providers to see neurology for management.  . Current regimen:  o Nothing currently.  . Interventions: o Discussed both side effects and lack of efficacy of past therapy.  . Patient self care activities - Over the next 90 days, patient will: o See neurology for evaluation and plan.   Medication management . Pharmacist Clinical Goal(s): o Over the next 90 days, patient will work with PharmD and providers to maintain optimal medication adherence . Current pharmacy: [facility] mail order . Interventions o Comprehensive medication review performed. o Continue current medication management strategy . Patient self care activities - Over the next 90 days, patient will: o Focus on medication adherence by using pill box o Take medications as prescribed o Report any questions or concerns to PharmD and/or provider(s)  Initial goal documentation        Michele Parker was given information about Chronic Care Management services today including:  1. CCM service includes personalized support from designated clinical staff supervised by her physician, including individualized plan of care and coordination with other care providers 2. 24/7 contact phone numbers for assistance for urgent and routine care needs. 3. Standard insurance, coinsurance, copays and deductibles apply for chronic care management only during months in which we provide at least 20 minutes of these services. Most insurances cover these services at 100%, however patients may be responsible for any copay, coinsurance and/or deductible if applicable. This service may help you  avoid the need for more expensive face-to-face services. 4. Only one practitioner may furnish and bill the service in a calendar month. 5. The patient may stop CCM services at any time (effective at the end of the month) by phone call to the office staff.  Patient agreed  to services and verbal consent obtained.   The patient verbalized understanding of instructions, educational materials, and care plan provided today and agreed to receive a mailed copy of patient instructions, educational materials, and care plan.  Telephone follow up appointment with pharmacy team member scheduled QVZ:DGLO 2022  Sherre Poot, PharmD Clinical Pharmacist [name] [phone] (office) [phone] (mobile)  Fall Prevention in the Home, Adult Falls can cause injuries. They can happen to people of all ages. There are many things you can do to make your home safe and to help prevent falls. Ask for help when making these changes, if needed. What actions can I take to prevent falls? General Instructions  Use good lighting in all rooms. Replace any light bulbs that burn out.  Turn on the lights when you go into a dark area. Use night-lights.  Keep items that you use often in easy-to-reach places. Lower the shelves around your home if necessary.  Set up your furniture so you have a clear path. Avoid moving your furniture around.  Do not have throw rugs and other things on the floor that can make you trip.  Avoid walking on wet floors.  If any of your floors are uneven, fix them.  Add color or contrast paint or tape to clearly mark and help you see: ? Any grab bars or handrails. ? First and last steps of stairways. ? Where the edge of each step is.  If you use a stepladder: ? Make sure that it is fully opened. Do not climb a closed stepladder. ? Make sure that both sides of the stepladder are locked into place. ? Ask someone to hold the stepladder for you while you use it.  If there are any pets around you, be aware of where they are. What can I do in the bathroom?      Keep the floor dry. Clean up any water that spills onto the floor as soon as it happens.  Remove soap buildup in the tub or shower regularly.  Use non-skid mats or decals on the  floor of the tub or shower.  Attach bath mats securely with double-sided, non-slip rug tape.  If you need to sit down in the shower, use a plastic, non-slip stool.  Install grab bars by the toilet and in the tub and shower. Do not use towel bars as grab bars. What can I do in the bedroom?  Make sure that you have a light by your bed that is easy to reach.  Do not use any sheets or blankets that are too big for your bed. They should not hang down onto the floor.  Have a firm chair that has side arms. You can use this for support while you get dressed. What can I do in the kitchen?  Clean up any spills right away.  If you need to reach something above you, use a strong step stool that has a grab bar.  Keep electrical cords out of the way.  Do not use floor polish or wax that makes floors slippery. If you must use wax, use non-skid floor wax. What can I do with my stairs?  Do not leave any items on  the stairs.  Make sure that you have a light switch at the top of the stairs and the bottom of the stairs. If you do not have them, ask someone to add them for you.  Make sure that there are handrails on both sides of the stairs, and use them. Fix handrails that are broken or loose. Make sure that handrails are as long as the stairways.  Install non-slip stair treads on all stairs in your home.  Avoid having throw rugs at the top or bottom of the stairs. If you do have throw rugs, attach them to the floor with carpet tape.  Choose a carpet that does not hide the edge of the steps on the stairway.  Check any carpeting to make sure that it is firmly attached to the stairs. Fix any carpet that is loose or worn. What can I do on the outside of my home?  Use bright outdoor lighting.  Regularly fix the edges of walkways and driveways and fix any cracks.  Remove anything that might make you trip as you walk through a door, such as a raised step or threshold.  Trim any bushes or trees on  the path to your home.  Regularly check to see if handrails are loose or broken. Make sure that both sides of any steps have handrails.  Install guardrails along the edges of any raised decks and porches.  Clear walking paths of anything that might make someone trip, such as tools or rocks.  Have any leaves, snow, or ice cleared regularly.  Use sand or salt on walking paths during winter.  Clean up any spills in your garage right away. This includes grease or oil spills. What other actions can I take?  Wear shoes that: ? Have a low heel. Do not wear high heels. ? Have rubber bottoms. ? Are comfortable and fit you well. ? Are closed at the toe. Do not wear open-toe sandals.  Use tools that help you move around (mobility aids) if they are needed. These include: ? Canes. ? Walkers. ? Scooters. ? Crutches.  Review your medicines with your doctor. Some medicines can make you feel dizzy. This can increase your chance of falling. Ask your doctor what other things you can do to help prevent falls. Where to find more information  Centers for Disease Control and Prevention, STEADI: https://garcia.biz/  [employer] on Aging: BrainJudge.co.uk Contact a doctor if:  You are afraid of falling at home.  You feel weak, drowsy, or dizzy at home.  You fall at home. Summary  There are many simple things that you can do to make your home safe and to help prevent falls.  Ways to make your home safe include removing tripping hazards and installing grab bars in the bathroom.  Ask for help when making these changes in your home. This information is not intended to replace advice given to you by your health care provider. Make sure you discuss any questions you have with your health care provider. Document Revised: 09/07/2018 Document Reviewed: 12/30/2016 Elsevier Patient Education  2020 [employer].

## 2020-05-24 ENCOUNTER — Other Ambulatory Visit: Payer: Self-pay | Admitting: Family Medicine

## 2020-06-21 ENCOUNTER — Other Ambulatory Visit: Payer: Self-pay

## 2020-06-21 ENCOUNTER — Telehealth (INDEPENDENT_AMBULATORY_CARE_PROVIDER_SITE_OTHER): Payer: Medicare HMO | Admitting: Family Medicine

## 2020-06-21 ENCOUNTER — Encounter: Payer: Self-pay | Admitting: Family Medicine

## 2020-06-21 VITALS — Temp 100.4°F

## 2020-06-21 DIAGNOSIS — R410 Disorientation, unspecified: Secondary | ICD-10-CM | POA: Diagnosis not present

## 2020-06-21 DIAGNOSIS — F03B18 Unspecified dementia, moderate, with other behavioral disturbance: Secondary | ICD-10-CM

## 2020-06-21 DIAGNOSIS — F0391 Unspecified dementia with behavioral disturbance: Secondary | ICD-10-CM

## 2020-06-21 DIAGNOSIS — N390 Urinary tract infection, site not specified: Secondary | ICD-10-CM | POA: Diagnosis not present

## 2020-06-21 DIAGNOSIS — U071 COVID-19: Secondary | ICD-10-CM | POA: Diagnosis not present

## 2020-06-21 NOTE — Progress Notes (Signed)
Virtual Visit via Telephone Note   This visit type was conducted due to national recommendations for restrictions regarding the COVID-19 Pandemic (e.g. social distancing) in an effort to limit this patient's exposure and mitigate transmission in our community.  Due to her co-morbid illnesses, this patient is at least at moderate risk for complications without adequate follow up.  This format is felt to be most appropriate for this patient at this time.  The patient did not have access to video technology/had technical difficulties with video requiring transitioning to audio format only (telephone).  All issues noted in this document were discussed and addressed.  No physical exam could be performed with this format.  Patient verbally consented to a telehealth visit.   Date:  06/21/2020   ID:  Michele, Parker 1941-03-03, MRN [id number]  Patient Location: Home Provider Location: Home Office  PCP:  Michele Brome, MD   Evaluation Performed: acute visit Chief Complaint:  Confusion   History of Present Illness:    Michele Parker is a 80 y.o. female with dementia who presented virtually for increased confusion.  I spoke with her daughter Michele Parker.  The patient became confused in the middle of the night in the past this has been related to urinary tract infections.  Her daughter called to see if she could give the last 3 days of Cipro she had from a previous infection.  She has Cipro 6 pills.  Her last urinary tract infection did not grow bacteria and so this was discontinued.  However, in addition the patient has been exposed to [place].  Her son-in-law has COVID-19 and is isolating in the home.  Both Michele Parker and her mother, Michele Parker are having coughing and congestion.  Michele Parker had symptoms first.  The patient was confused and poured her mild in her cough instead of in her cereal. She was seeing her dead grandson.  They are currently snowed in and cannot get out the driveway. Pulse ox 05%  The patient does  have symptoms concerning for COVID-19 infection (fever, chills, cough, or new shortness of breath).    Past Medical History:  Diagnosis Date  . Angioedema   . Bradycardia   . Cervicalgia   . Depression   . Essential hypertension   . Hemochromatosis   . Hemorrhoid   . Hereditary hemochromatosis ([place])   . HTN (hypertension)   . Hypercalcemia   . Hyperlipidemia   . Hypothyroid   . Memory loss   . Osteopenia   . Osteopenia   . Other amnesia   . Other constipation   . Repeated falls   . Tremor   . Vitamin D deficiency     Past Surgical History:  Procedure Laterality Date  . CHOLECYSTECTOMY    . KNEE SURGERY    . MINOR HEMORRHOIDECTOMY    . SHOULDER SURGERY    . TUBAL LIGATION      Family History  Problem Relation Age of Onset  . Heart attack Mother   . Heart disease Mother   . CAD Brother   . Alcohol abuse Brother   . Other Father        unsure of medical hx - died in MVA when patient was 80 years old    Social History   Socioeconomic History  . Marital status: Married    Spouse name: Not on file  . Number of children: 2  . Years of education: 34  . Highest education level: High school graduate  Occupational History  .  Occupation: Retired-hosiery  Tobacco Use  . Smoking status: Never Smoker  . Smokeless tobacco: Never Used  Vaping Use  . Vaping Use: Never used  Substance and Sexual Activity  . Alcohol use: No  . Drug use: No  . Sexual activity: Not on file  Other Topics Concern  . Not on file  Social History Narrative   Lives at home with her husband.   Left-handed.   2 cups caffeine per day.   Social Determinants of Health   Financial Resource Strain: Not on file  Food Insecurity: No Food Insecurity  . Worried About Charity fundraiser in the Last Year: Never true  . Ran Out of Food in the Last Year: Never true  Transportation Needs: No Transportation Needs  . Lack of Transportation (Medical): No  . Lack of Transportation (Non-Medical): No   Physical Activity: Not on file  Stress: Not on file  Social Connections: Not on file  Intimate Partner Violence: Not on file    Outpatient Medications Prior to Visit  Medication Sig Dispense Refill  . benazepril-hydrochlorthiazide (LOTENSIN HCT) 20-12.5 MG tablet Take 1 tablet by mouth 2 (two) times daily. 180 tablet 0  . benzonatate (TESSALON) 100 MG capsule Take 1 capsule (100 mg total) by mouth 2 (two) times daily as needed for cough. 20 capsule 0  . buPROPion (WELLBUTRIN XL) 150 MG 24 hr tablet Take 150 mg by mouth in the morning.     . cetirizine (ZYRTEC) 10 MG tablet Take 10 mg by mouth daily.    . Cholecalciferol (VITAMIN D3) 25 MCG (1000 UT) CAPS Take 1,000 Units by mouth.     . ciprofloxacin (CIPRO) 250 MG tablet Take 1 tablet (250 mg total) by mouth 2 (two) times daily. 14 tablet 0  . famotidine (PEPCID) 20 MG tablet Take 20 mg by mouth daily as needed.     [name] FLUoxetine (PROZAC) 10 MG capsule Take 20 mg by mouth daily.     Michele Parker Oil 1000 MG CAPS Take 1 capsule by mouth daily.    . Menaquinone-7 (VITAMIN K2 PO) Take 1 tablet by mouth daily.    . Multiple Vitamin (MULTIVITAMIN) capsule Take 1 capsule by mouth daily.    [name] OLANZapine (ZYPREXA) 5 MG tablet Take 5 mg by mouth at bedtime.     . potassium chloride (MICRO-K) 10 MEQ CR capsule Take 1 capsule (10 mEq total) by mouth daily. 90 capsule 1  . SYNTHROID 50 MCG tablet TAKE 1 TABLET DAILY BEFORE BREAKFAST. 90 tablet 1   No facility-administered medications prior to visit.    Allergies:   Bystolic [nebivolol hcl], Aricept [donepezil], Lisinopril, Other, and Oxycodone   Social History   Tobacco Use  . Smoking status: Never Smoker  . Smokeless tobacco: Never Used  Vaping Use  . Vaping Use: Never used  Substance Use Topics  . Alcohol use: No  . Drug use: No     Review of Systems  Constitutional: Positive for fever. Negative for chills and malaise/fatigue.  HENT: Positive for congestion. Negative for ear pain, sinus  pain and sore throat.   Respiratory: Positive for cough. Negative for shortness of breath.   Cardiovascular: Negative for chest pain.  Musculoskeletal: Negative for myalgias.  Neurological: Negative for headaches.  Psychiatric/Behavioral: Positive for hallucinations and memory loss.     Labs/Other Tests and Data Reviewed:    Recent Labs: 08/14/2019: ALT 16; BUN 22; Creatinine, Ser 1.06; Hemoglobin 14.2; Platelets 239; Potassium 4.5; Sodium 137 05/05/2020:  TSH 2.490   Recent Lipid Panel Lab Results  Component Value Date/Time   CHOL 238 (H) 08/14/2019 11:36 AM   TRIG 156 (H) 08/14/2019 11:36 AM   HDL 70 08/14/2019 11:36 AM   CHOLHDL 3.4 08/14/2019 11:36 AM   LDLCALC 141 (H) 08/14/2019 11:36 AM    Wt Readings from Last 3 Encounters:  05/05/20 145 lb 3.2 oz (65.9 kg)  02/08/20 150 lb (68 kg)  01/17/20 144 lb 3.2 oz (65.4 kg)     Objective:    Vital Signs:  Temp (!) 100.4 F (38 C)   SpO2 95%    Physical Exam   ASSESSMENT & PLAN:   1. Recurrent UTI Start cipro one twice a day x 5 days.  2. Confusion May be due to UTI or possibly covid 19.  3. COVID-19 determined by clinical diagnostic criteria Likely has covid 19. Not hypoxia.  Not vaccinated. Push fluids and rest.  Be sure she is eating.   4.Moderate dementia with behavioral disorders. Baseline dementia with confusion worsening.   If pt worsens, I would recommend go to Urgent Care or ED either tomorrow or today if needed. Call EMS if needed. May call Monday and come for covid 19 testing. If requires face to face visit on Monday, will follow PPE guidelines and bring her in to the office.    COVID-19 Education: Recommend quarantine with family. Family members should be wearing masks  I spent 15 minutes dedicated to the care of this patient on the date of this encounter to include history and education, as well as instructions/recommendations.   Follow Up:  In Person prn  Signed,  Michele Brome, MD  06/21/2020  3:24 PM    [place]

## 2020-06-23 ENCOUNTER — Ambulatory Visit (INDEPENDENT_AMBULATORY_CARE_PROVIDER_SITE_OTHER): Payer: Medicare HMO | Admitting: Family Medicine

## 2020-06-23 ENCOUNTER — Encounter: Payer: Self-pay | Admitting: Family Medicine

## 2020-06-23 ENCOUNTER — Telehealth: Payer: Medicare HMO | Admitting: Family Medicine

## 2020-06-23 VITALS — BP 110/60 | HR 93 | Temp 97.3°F | Resp 18

## 2020-06-23 DIAGNOSIS — F0391 Unspecified dementia with behavioral disturbance: Secondary | ICD-10-CM | POA: Diagnosis not present

## 2020-06-23 DIAGNOSIS — R059 Cough, unspecified: Secondary | ICD-10-CM | POA: Diagnosis not present

## 2020-06-23 DIAGNOSIS — R296 Repeated falls: Secondary | ICD-10-CM | POA: Diagnosis not present

## 2020-06-23 DIAGNOSIS — G3183 Dementia with Lewy bodies: Secondary | ICD-10-CM

## 2020-06-23 DIAGNOSIS — R441 Visual hallucinations: Secondary | ICD-10-CM | POA: Diagnosis not present

## 2020-06-23 DIAGNOSIS — F02818 Dementia in other diseases classified elsewhere, unspecified severity, with other behavioral disturbance: Secondary | ICD-10-CM

## 2020-06-23 DIAGNOSIS — Z87440 Personal history of urinary (tract) infections: Secondary | ICD-10-CM

## 2020-06-23 DIAGNOSIS — R4182 Altered mental status, unspecified: Secondary | ICD-10-CM

## 2020-06-23 DIAGNOSIS — R410 Disorientation, unspecified: Secondary | ICD-10-CM | POA: Diagnosis not present

## 2020-06-23 DIAGNOSIS — F0281 Dementia in other diseases classified elsewhere with behavioral disturbance: Secondary | ICD-10-CM

## 2020-06-23 LAB — POC COVID19 BINAXNOW: SARS Coronavirus 2 Ag: NEGATIVE
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
## 2020-06-26 LAB — CBC WITH DIFFERENTIAL/PLATELET
Basophils Absolute: 0 10*3/uL (ref 0.0–0.2)
Basos: 0 %
EOS (ABSOLUTE): 0 10*3/uL (ref 0.0–0.4)
Eos: 0 %
Hematocrit: 42.6 % (ref 34.0–46.6)
Hemoglobin: 14.5 g/dL (ref 11.1–15.9)
Immature Grans (Abs): 0 10*3/uL (ref 0.0–0.1)
Immature Granulocytes: 1 %
Lymphocytes Absolute: 1.2 10*3/uL (ref 0.7–3.1)
Lymphs: 25 %
MCH: 30.1 pg (ref 26.6–33.0)
MCHC: 34 g/dL (ref 31.5–35.7)
MCV: 88 fL (ref 79–97)
Monocytes Absolute: 0.7 10*3/uL (ref 0.1–0.9)
Monocytes: 14 %
Neutrophils Absolute: 3 10*3/uL (ref 1.4–7.0)
Neutrophils: 60 %
Platelets: 201 10*3/uL (ref 150–450)
RBC: 4.82 x10E6/uL (ref 3.77–5.28)
RDW: 11.8 % (ref 11.7–15.4)
WBC: 5 10*3/uL (ref 3.4–10.8)

## 2020-06-26 LAB — COMPREHENSIVE METABOLIC PANEL
ALT: 35 IU/L — ABNORMAL HIGH (ref 0–32)
AST: 39 IU/L (ref 0–40)
Albumin/Globulin Ratio: 2 (ref 1.2–2.2)
Albumin: 4.2 g/dL (ref 3.6–4.6)
Alkaline Phosphatase: 75 IU/L (ref 44–121)
BUN/Creatinine Ratio: 19 (ref 12–28)
BUN: 26 mg/dL (ref 8–27)
Bilirubin Total: 0.3 mg/dL (ref 0.0–1.2)
CO2: 23 mmol/L (ref 20–29)
Calcium: 8.9 mg/dL (ref 8.7–10.3)
Chloride: 94 mmol/L — ABNORMAL LOW (ref 96–106)
Creatinine, Ser: 1.39 mg/dL — ABNORMAL HIGH (ref 0.57–1.00)
GFR calc Af Amer: 41 mL/min/{1.73_m2} — ABNORMAL LOW (ref >59)
GFR calc non Af Amer: 36 mL/min/{1.73_m2} — ABNORMAL LOW (ref >59)
Globulin, Total: 2.1 g/dL (ref 1.5–4.5)
Glucose: 146 mg/dL — ABNORMAL HIGH (ref 65–99)
Potassium: 3.8 mmol/L (ref 3.5–5.2)
Sodium: 132 mmol/L — ABNORMAL LOW (ref 134–144)
Total Protein: 6.3 g/dL (ref 6.0–8.5)

## 2020-06-26 LAB — METHYLMALONIC ACID, SERUM: Methylmalonic Acid: 189 nmol/L (ref 0–378)

## 2020-06-26 LAB — B12 AND FOLATE PANEL
Folate: >20 ng/mL (ref >3.0)
Vitamin B-12: 693 pg/mL (ref 232–1245)

## 2020-07-01 ENCOUNTER — Other Ambulatory Visit: Payer: Self-pay

## 2020-07-01 DIAGNOSIS — F0391 Unspecified dementia with behavioral disturbance: Secondary | ICD-10-CM

## 2020-07-07 DIAGNOSIS — J209 Acute bronchitis, unspecified: Secondary | ICD-10-CM | POA: Diagnosis not present

## 2020-07-07 DIAGNOSIS — Z20822 Contact with and (suspected) exposure to covid-19: Secondary | ICD-10-CM | POA: Diagnosis not present

## 2020-07-17 ENCOUNTER — Telehealth: Payer: Self-pay

## 2020-07-17 DIAGNOSIS — R296 Repeated falls: Secondary | ICD-10-CM

## 2020-07-17 NOTE — Telephone Encounter (Signed)
Dr. [name]/nurses  Please advise.  [facility] called back after I refaxed it with the weakness and frequent falling and they said that [facility] won't accept that as well she tried finding something on her problem list but couldn't. She said falls and weakness are symptom codes not diagnosis codes. [facility] said if we find a diagnosis code to link they would be happy to take her.

## 2020-07-17 NOTE — Telephone Encounter (Signed)
Dr. [name]/nursees please advise.  [facility] called about pt's referral for Home Health. They need another diagnosis code before [place] will cover it they said any mental problems even dementia is considered a pyscology referral. If we could have another order with a new code I'll fax it over. I didn't see another one we could use I wasn't sure which one to go with.

## 2020-07-17 NOTE — Telephone Encounter (Signed)
Used imbalance and falls. Kc

## 2020-07-17 NOTE — Telephone Encounter (Signed)
I spoke with [facility].  They are refiling with dx of Lewy Body Dementia with behavioral disturbance.  Hopefully her insurance will approve it.

## 2020-07-17 NOTE — Telephone Encounter (Signed)
New Home Health order placed.

## 2020-07-19 DIAGNOSIS — I10 Essential (primary) hypertension: Secondary | ICD-10-CM | POA: Diagnosis not present

## 2020-07-19 DIAGNOSIS — G3183 Dementia with Lewy bodies: Secondary | ICD-10-CM | POA: Diagnosis not present

## 2020-07-19 DIAGNOSIS — R296 Repeated falls: Secondary | ICD-10-CM | POA: Diagnosis not present

## 2020-07-19 DIAGNOSIS — Z87440 Personal history of urinary (tract) infections: Secondary | ICD-10-CM | POA: Diagnosis not present

## 2020-07-19 DIAGNOSIS — F29 Unspecified psychosis not due to a substance or known physiological condition: Secondary | ICD-10-CM | POA: Diagnosis not present

## 2020-07-19 DIAGNOSIS — F3341 Major depressive disorder, recurrent, in partial remission: Secondary | ICD-10-CM | POA: Diagnosis not present

## 2020-07-19 DIAGNOSIS — M1380 Other specified arthritis, unspecified site: Secondary | ICD-10-CM | POA: Diagnosis not present

## 2020-07-19 DIAGNOSIS — R2689 Other abnormalities of gait and mobility: Secondary | ICD-10-CM | POA: Diagnosis not present

## 2020-07-20 ENCOUNTER — Other Ambulatory Visit: Payer: Self-pay | Admitting: Family Medicine

## 2020-07-21 ENCOUNTER — Telehealth: Payer: Self-pay

## 2020-07-21 NOTE — Telephone Encounter (Signed)
[facility] calling requesting verbal for orders. Nursing and PT due to increased risk for fall. CMA gave verbal ok for these orders.   Harrell Lark 07/21/20 4:36 PM

## 2020-07-24 DIAGNOSIS — F29 Unspecified psychosis not due to a substance or known physiological condition: Secondary | ICD-10-CM | POA: Diagnosis not present

## 2020-07-24 DIAGNOSIS — R2689 Other abnormalities of gait and mobility: Secondary | ICD-10-CM | POA: Diagnosis not present

## 2020-07-24 DIAGNOSIS — R296 Repeated falls: Secondary | ICD-10-CM | POA: Diagnosis not present

## 2020-07-24 DIAGNOSIS — I10 Essential (primary) hypertension: Secondary | ICD-10-CM | POA: Diagnosis not present

## 2020-07-24 DIAGNOSIS — Z87440 Personal history of urinary (tract) infections: Secondary | ICD-10-CM | POA: Diagnosis not present

## 2020-07-24 DIAGNOSIS — F3341 Major depressive disorder, recurrent, in partial remission: Secondary | ICD-10-CM | POA: Diagnosis not present

## 2020-07-24 DIAGNOSIS — M1380 Other specified arthritis, unspecified site: Secondary | ICD-10-CM | POA: Diagnosis not present

## 2020-07-24 DIAGNOSIS — G3183 Dementia with Lewy bodies: Secondary | ICD-10-CM | POA: Diagnosis not present

## 2020-07-25 ENCOUNTER — Telehealth: Payer: Self-pay | Admitting: Nurse Practitioner

## 2020-07-25 ENCOUNTER — Other Ambulatory Visit: Payer: Self-pay | Admitting: Nurse Practitioner

## 2020-07-25 DIAGNOSIS — R296 Repeated falls: Secondary | ICD-10-CM

## 2020-07-25 DIAGNOSIS — F3341 Major depressive disorder, recurrent, in partial remission: Secondary | ICD-10-CM

## 2020-07-25 DIAGNOSIS — F29 Unspecified psychosis not due to a substance or known physiological condition: Secondary | ICD-10-CM

## 2020-07-25 DIAGNOSIS — Z87440 Personal history of urinary (tract) infections: Secondary | ICD-10-CM

## 2020-07-25 DIAGNOSIS — M1380 Other specified arthritis, unspecified site: Secondary | ICD-10-CM

## 2020-07-25 DIAGNOSIS — R2689 Other abnormalities of gait and mobility: Secondary | ICD-10-CM

## 2020-07-25 DIAGNOSIS — I10 Essential (primary) hypertension: Secondary | ICD-10-CM

## 2020-07-25 DIAGNOSIS — G3183 Dementia with Lewy bodies: Secondary | ICD-10-CM

## 2020-07-25 MED ORDER — BENAZEPRIL-HYDROCHLOROTHIAZIDE 20-12.5 MG PO TABS: 1.0000 | ORAL_TABLET | Freq: Two times a day (BID) | ORAL | 0 refills | Status: DC

## 2020-07-25 NOTE — Telephone Encounter (Signed)
Pt's daughter called after hours phone. States pt has not received mail-order BP med. Has requested 14 tablets be sent to [facility]

## 2020-07-28 ENCOUNTER — Telehealth: Payer: Self-pay

## 2020-07-28 NOTE — Telephone Encounter (Signed)
Derrick PT with [facility] left VM requesting orders for [facility] PT for pt. Schedule: once a week for one week. Three times a week for two weeks. Two times a week for three weeks. Once a week for one week.   Returned call. Gave verbal.   Royce Macadamia, [place] 07/28/20 12:12 PM

## 2020-07-29 DIAGNOSIS — G3183 Dementia with Lewy bodies: Secondary | ICD-10-CM | POA: Diagnosis not present

## 2020-07-29 DIAGNOSIS — F29 Unspecified psychosis not due to a substance or known physiological condition: Secondary | ICD-10-CM | POA: Diagnosis not present

## 2020-07-29 DIAGNOSIS — F3341 Major depressive disorder, recurrent, in partial remission: Secondary | ICD-10-CM | POA: Diagnosis not present

## 2020-07-29 DIAGNOSIS — I10 Essential (primary) hypertension: Secondary | ICD-10-CM | POA: Diagnosis not present

## 2020-07-29 DIAGNOSIS — R296 Repeated falls: Secondary | ICD-10-CM | POA: Diagnosis not present

## 2020-07-29 DIAGNOSIS — R2689 Other abnormalities of gait and mobility: Secondary | ICD-10-CM | POA: Diagnosis not present

## 2020-07-29 DIAGNOSIS — Z87440 Personal history of urinary (tract) infections: Secondary | ICD-10-CM | POA: Diagnosis not present

## 2020-07-29 DIAGNOSIS — M1380 Other specified arthritis, unspecified site: Secondary | ICD-10-CM | POA: Diagnosis not present

## 2020-07-30 DIAGNOSIS — Z87440 Personal history of urinary (tract) infections: Secondary | ICD-10-CM | POA: Diagnosis not present

## 2020-07-30 DIAGNOSIS — F411 Generalized anxiety disorder: Secondary | ICD-10-CM | POA: Diagnosis not present

## 2020-07-30 DIAGNOSIS — F3341 Major depressive disorder, recurrent, in partial remission: Secondary | ICD-10-CM | POA: Diagnosis not present

## 2020-07-30 DIAGNOSIS — F29 Unspecified psychosis not due to a substance or known physiological condition: Secondary | ICD-10-CM | POA: Diagnosis not present

## 2020-07-30 DIAGNOSIS — F0390 Unspecified dementia without behavioral disturbance: Secondary | ICD-10-CM | POA: Diagnosis not present

## 2020-07-30 DIAGNOSIS — I10 Essential (primary) hypertension: Secondary | ICD-10-CM | POA: Diagnosis not present

## 2020-07-30 DIAGNOSIS — M1380 Other specified arthritis, unspecified site: Secondary | ICD-10-CM | POA: Diagnosis not present

## 2020-07-30 DIAGNOSIS — G3183 Dementia with Lewy bodies: Secondary | ICD-10-CM | POA: Diagnosis not present

## 2020-07-30 DIAGNOSIS — R2689 Other abnormalities of gait and mobility: Secondary | ICD-10-CM | POA: Diagnosis not present

## 2020-07-30 DIAGNOSIS — R296 Repeated falls: Secondary | ICD-10-CM | POA: Diagnosis not present

## 2020-07-31 DIAGNOSIS — I10 Essential (primary) hypertension: Secondary | ICD-10-CM | POA: Diagnosis not present

## 2020-07-31 DIAGNOSIS — G3183 Dementia with Lewy bodies: Secondary | ICD-10-CM | POA: Diagnosis not present

## 2020-07-31 DIAGNOSIS — M1380 Other specified arthritis, unspecified site: Secondary | ICD-10-CM | POA: Diagnosis not present

## 2020-07-31 DIAGNOSIS — F3341 Major depressive disorder, recurrent, in partial remission: Secondary | ICD-10-CM | POA: Diagnosis not present

## 2020-07-31 DIAGNOSIS — Z87440 Personal history of urinary (tract) infections: Secondary | ICD-10-CM | POA: Diagnosis not present

## 2020-07-31 DIAGNOSIS — F29 Unspecified psychosis not due to a substance or known physiological condition: Secondary | ICD-10-CM | POA: Diagnosis not present

## 2020-07-31 DIAGNOSIS — R2689 Other abnormalities of gait and mobility: Secondary | ICD-10-CM | POA: Diagnosis not present

## 2020-07-31 DIAGNOSIS — R296 Repeated falls: Secondary | ICD-10-CM | POA: Diagnosis not present

## 2020-08-01 ENCOUNTER — Telehealth: Payer: Self-pay

## 2020-08-01 DIAGNOSIS — R296 Repeated falls: Secondary | ICD-10-CM | POA: Diagnosis not present

## 2020-08-01 DIAGNOSIS — Z87440 Personal history of urinary (tract) infections: Secondary | ICD-10-CM | POA: Diagnosis not present

## 2020-08-01 DIAGNOSIS — F3341 Major depressive disorder, recurrent, in partial remission: Secondary | ICD-10-CM | POA: Diagnosis not present

## 2020-08-01 DIAGNOSIS — G3183 Dementia with Lewy bodies: Secondary | ICD-10-CM | POA: Diagnosis not present

## 2020-08-01 DIAGNOSIS — I10 Essential (primary) hypertension: Secondary | ICD-10-CM | POA: Diagnosis not present

## 2020-08-01 DIAGNOSIS — M1380 Other specified arthritis, unspecified site: Secondary | ICD-10-CM | POA: Diagnosis not present

## 2020-08-01 DIAGNOSIS — F29 Unspecified psychosis not due to a substance or known physiological condition: Secondary | ICD-10-CM | POA: Diagnosis not present

## 2020-08-01 DIAGNOSIS — R2689 Other abnormalities of gait and mobility: Secondary | ICD-10-CM | POA: Diagnosis not present

## 2020-08-01 NOTE — Progress Notes (Signed)
° ° °  Chronic Care Management Pharmacy Assistant   Name: Michele Parker  MRN: [id number] DOB: 1940-10-10  Reason for Encounter:  General disease state Patient Questions:  1.  Have you seen any other providers since your last visit? No    2.  Any changes in your medicines or health?  Home Health order placed 07/17/20 Increased fall risk    PCP : Michele Brome, MD  Allergies:   Allergies  Allergen Reactions   Bystolic [Nebivolol Hcl] Other (See Comments)    Bradycardia   Aricept [Donepezil]     Nausea and vomiting.   Lisinopril Swelling   Other     Other reaction(s): GI Upset (intolerance)   Oxycodone     rash    Medications: Outpatient Encounter Medications as of 08/01/2020  Medication Sig   benazepril-hydrochlorthiazide (LOTENSIN HCT) 20-12.5 MG tablet Take 1 tablet by mouth 2 (two) times daily.   benzonatate (TESSALON) 100 MG capsule Take 1 capsule (100 mg total) by mouth 2 (two) times daily as needed for cough.   buPROPion (WELLBUTRIN XL) 150 MG 24 hr tablet Take 150 mg by mouth in the morning.    cetirizine (ZYRTEC) 10 MG tablet Take 10 mg by mouth daily.   Cholecalciferol (VITAMIN D3) 25 MCG (1000 UT) CAPS Take 1,000 Units by mouth.    ciprofloxacin (CIPRO) 250 MG tablet Take 1 tablet (250 mg total) by mouth 2 (two) times daily.   famotidine (PEPCID) 20 MG tablet Take 20 mg by mouth daily as needed.    FLUoxetine (PROZAC) 10 MG capsule Take 20 mg by mouth daily.    Krill Oil 1000 MG CAPS Take 1 capsule by mouth daily.   Menaquinone-7 (VITAMIN K2 PO) Take 1 tablet by mouth daily.   Multiple Vitamin (MULTIVITAMIN) capsule Take 1 capsule by mouth daily.   OLANZapine (ZYPREXA) 5 MG tablet Take 5 mg by mouth at bedtime.    potassium chloride (MICRO-K) 10 MEQ CR capsule Take 1 capsule (10 mEq total) by mouth daily.   SYNTHROID 50 MCG tablet TAKE 1 TABLET DAILY BEFORE BREAKFAST.   No facility-administered encounter medications on file as of 08/01/2020.     Current Diagnosis: Patient Active Problem List   Diagnosis Date Noted   Allergies 02/08/2020   Primary hemochromatosis ([place]) 07/30/2019   Mild cognitive disorder 09/27/2017   Gait abnormality 09/27/2017   Tremor 09/27/2017   Arthritis 06/09/2016   Cognitive impairment 09/27/2013   MDD (major depressive disorder), recurrent, in partial remission ([place]) 09/27/2013   Severe major depression with psychotic features ([place]) 09/27/2013   Moderate dementia without behavioral disturbance ([place]) 09/27/2013   Psychosis ([place]) 09/06/2013   HTN (hypertension) 07/20/2013   Hyperlipidemia 07/20/2013   Palpitations 07/20/2013    Unable to reach patient, she had an appointment with Dr. Tobie Parker on 08/06/20  Neurology referral made  Michele Parker, the patients daughter called me back, stated patient is doing ok, she is going to talk to neurology about taking her off some of her medications as they may be making other issues worse.    Patient is taking physical therapy 3 times a week to get her strength back.   She is taking her current medications as directed.  Michele Parker stated her moms hallucinations have gotten some better.    No medication gap available  Care gaps  Mammogram  Dexascan   Follow-Up:  Pharmacist Review  Michele Parker, [place], [place] Pharmacist Assistant [phone]

## 2020-08-05 DIAGNOSIS — F3341 Major depressive disorder, recurrent, in partial remission: Secondary | ICD-10-CM | POA: Diagnosis not present

## 2020-08-05 DIAGNOSIS — M1380 Other specified arthritis, unspecified site: Secondary | ICD-10-CM | POA: Diagnosis not present

## 2020-08-05 DIAGNOSIS — G3183 Dementia with Lewy bodies: Secondary | ICD-10-CM | POA: Diagnosis not present

## 2020-08-05 DIAGNOSIS — R296 Repeated falls: Secondary | ICD-10-CM | POA: Diagnosis not present

## 2020-08-05 DIAGNOSIS — F29 Unspecified psychosis not due to a substance or known physiological condition: Secondary | ICD-10-CM | POA: Diagnosis not present

## 2020-08-05 DIAGNOSIS — Z87440 Personal history of urinary (tract) infections: Secondary | ICD-10-CM | POA: Diagnosis not present

## 2020-08-05 DIAGNOSIS — I10 Essential (primary) hypertension: Secondary | ICD-10-CM | POA: Diagnosis not present

## 2020-08-05 DIAGNOSIS — R2689 Other abnormalities of gait and mobility: Secondary | ICD-10-CM | POA: Diagnosis not present

## 2020-08-05 NOTE — Progress Notes (Signed)
Subjective:  Patient ID: Michele Parker, female    DOB: 11-11-1940  Age: 80 y.o. MRN: [id number]  Chief Complaint  Patient presents with  . Hyperlipidemia  . Hypertension    HPI Essential hypertension Taking benazepril-hctz 20/12.5 mg one twice a day.  Moderate dementia with behavioral disturbance ([facility]) Taking prozac. Decreased falling, continued hallucinations. PT is helping. NO bladder sxs On wellbutrin xl, prozac,  Hypothyroidism-Taking synthroid   Home PT 3 x per week. Skilled nursing once a week.   Current Outpatient Medications on File Prior to Visit  Medication Sig Dispense Refill  . benazepril-hydrochlorthiazide (LOTENSIN HCT) 20-12.5 MG tablet Take 1 tablet by mouth 2 (two) times daily. 14 tablet 0  . buPROPion (WELLBUTRIN XL) 150 MG 24 hr tablet Take 150 mg by mouth in the morning.     . cetirizine (ZYRTEC) 10 MG tablet Take 10 mg by mouth daily.    . Cholecalciferol (VITAMIN D3) 25 MCG (1000 UT) CAPS Take 1,000 Units by mouth.     . famotidine (PEPCID) 20 MG tablet Take 20 mg by mouth daily as needed.     [name] FLUoxetine (PROZAC) 10 MG capsule Take 20 mg by mouth daily.     Javier Docker Oil 1000 MG CAPS Take 1 capsule by mouth daily.    . Menaquinone-7 (VITAMIN K2 PO) Take 1 tablet by mouth daily.    . Multiple Vitamin (MULTIVITAMIN) capsule Take 1 capsule by mouth daily.    [name] OLANZapine (ZYPREXA) 5 MG tablet Take 5 mg by mouth at bedtime.     . potassium chloride (MICRO-K) 10 MEQ CR capsule Take 1 capsule (10 mEq total) by mouth daily. 90 capsule 1  . SYNTHROID 50 MCG tablet TAKE 1 TABLET DAILY BEFORE BREAKFAST. 90 tablet 1   No current facility-administered medications on file prior to visit.   Past Medical History:  Diagnosis Date  . Angioedema   . Bradycardia   . Cervicalgia   . Depression   . Essential hypertension   . Hemochromatosis   . Hemorrhoid   . Hereditary hemochromatosis ([place])   . HTN (hypertension)   . Hypercalcemia   . Hyperlipidemia   .  Hypothyroid   . Memory loss   . Osteopenia   . Osteopenia   . Other amnesia   . Other constipation   . Repeated falls   . Tremor   . Vitamin D deficiency    Past Surgical History:  Procedure Laterality Date  . CHOLECYSTECTOMY    . KNEE SURGERY    . MINOR HEMORRHOIDECTOMY    . SHOULDER SURGERY    . TUBAL LIGATION      Family History  Problem Relation Age of Onset  . Heart attack Mother   . Heart disease Mother   . CAD Brother   . Alcohol abuse Brother   . Other Father        unsure of medical hx - died in MVA when patient was 80 years old   Social History   Socioeconomic History  . Marital status: Married    Spouse name: Not on file  . Number of children: 2  . Years of education: 13  . Highest education level: High school graduate  Occupational History  . Occupation: Retired-hosiery  Tobacco Use  . Smoking status: Never Smoker  . Smokeless tobacco: Never Used  Vaping Use  . Vaping Use: Never used  Substance and Sexual Activity  . Alcohol use: No  . Drug use: No  .  Sexual activity: Not on file  Other Topics Concern  . Not on file  Social History Narrative   Lives at home with her husband.   Left-handed.   2 cups caffeine per day.   Social Determinants of Health   Financial Resource Strain: Not on file  Food Insecurity: No Food Insecurity  . Worried About Charity fundraiser in the Last Year: Never true  . Ran Out of Food in the Last Year: Never true  Transportation Needs: No Transportation Needs  . Lack of Transportation (Medical): No  . Lack of Transportation (Non-Medical): No  Physical Activity: Not on file  Stress: Not on file  Social Connections: Not on file    Review of Systems  Constitutional: Negative for chills, fatigue and fever.  HENT: Negative for congestion, ear pain, rhinorrhea and sore throat.   Respiratory: Negative for cough and shortness of breath.   Cardiovascular: Negative for chest pain.  Gastrointestinal: Negative for abdominal  pain, constipation, diarrhea, nausea and vomiting.  Genitourinary: Negative for dysuria and urgency.  Musculoskeletal: Negative for back pain and myalgias.  Neurological: Positive for weakness and light-headedness. Negative for dizziness and headaches.  Psychiatric/Behavioral: Negative for dysphoric mood. The patient is not nervous/anxious.      Objective:  BP 104/62   Pulse 72   Temp (!) 96.8 F (36 C)   Resp 16   Ht 5\' 1"  (1.549 m)   Wt 147 lb (66.7 kg)   BMI 27.78 kg/m   BP/Weight 08/06/2020 06/23/2020 69/10/7891  Systolic BP 810 175 102  Diastolic BP 62 60 60  Wt. (Lbs) 147 - 145.2  BMI 27.78 - 27.44    Physical Exam Vitals reviewed.  Constitutional:      Appearance: Normal appearance. She is normal weight.  Neck:     Vascular: No carotid bruit.  Cardiovascular:     Rate and Rhythm: Normal rate and regular rhythm.     Pulses: Normal pulses.     Heart sounds: Normal heart sounds.  Pulmonary:     Effort: Pulmonary effort is normal. No respiratory distress.     Breath sounds: Normal breath sounds.  Abdominal:     General: Abdomen is flat. Bowel sounds are normal.     Palpations: Abdomen is soft.     Tenderness: There is no abdominal tenderness.  Neurological:     Mental Status: She is alert.  Psychiatric:        Behavior: Behavior normal.     Comments: Flat affect      Lab Results  Component Value Date   WBC 6.8 08/06/2020   HGB 13.8 08/06/2020   HCT 40.4 08/06/2020   PLT 222 08/06/2020   GLUCOSE 149 (H) 08/06/2020   CHOL 238 (H) 08/14/2019   TRIG 156 (H) 08/14/2019   HDL 70 08/14/2019   LDLCALC 141 (H) 08/14/2019   ALT 20 08/06/2020   AST 20 08/06/2020   NA 143 08/06/2020   K 4.5 08/06/2020   CL 104 08/06/2020   CREATININE 1.18 (H) 08/06/2020   BUN 27 08/06/2020   CO2 21 08/06/2020   TSH 2.490 05/05/2020      Assessment & Plan:   1. Essential hypertension Well controlled.  No changes to medicines.  Continue to work on eating a healthy diet  and exercise.  Labs drawn today.  - CBC with Differential/Platelet - Comprehensive metabolic panel  2. Mixed hyperlipidemia No medicine recommended.  3. Lewy body dementia with behavioral disturbance ([place]) Keep appts with  neurology.  Continue home health care.   4. Acquired hypothyroidism The current medical regimen is effective;  continue present plan and medications.  5. Generalized weakness  continue physical therapy.    Orders Placed This Encounter  Procedures  . CBC with Differential/Platelet  . Comprehensive metabolic panel    Follow-up: Return in about 3 months (around 11/06/2020).  An After Visit Summary was printed and given to the patient.  [name], MD [facility] [phone]

## 2020-08-06 ENCOUNTER — Other Ambulatory Visit: Payer: Self-pay

## 2020-08-06 ENCOUNTER — Ambulatory Visit (INDEPENDENT_AMBULATORY_CARE_PROVIDER_SITE_OTHER): Payer: Medicare HMO | Admitting: Family Medicine

## 2020-08-06 ENCOUNTER — Other Ambulatory Visit: Payer: Self-pay | Admitting: Family Medicine

## 2020-08-06 VITALS — BP 104/62 | HR 72 | Temp 96.8°F | Resp 16 | Ht 61.0 in | Wt 147.0 lb

## 2020-08-06 DIAGNOSIS — F02818 Dementia in other diseases classified elsewhere, unspecified severity, with other behavioral disturbance: Secondary | ICD-10-CM

## 2020-08-06 DIAGNOSIS — F0281 Dementia in other diseases classified elsewhere with behavioral disturbance: Secondary | ICD-10-CM

## 2020-08-06 DIAGNOSIS — G3183 Dementia with Lewy bodies: Secondary | ICD-10-CM

## 2020-08-06 DIAGNOSIS — I10 Essential (primary) hypertension: Secondary | ICD-10-CM

## 2020-08-06 DIAGNOSIS — E039 Hypothyroidism, unspecified: Secondary | ICD-10-CM

## 2020-08-06 DIAGNOSIS — F3341 Major depressive disorder, recurrent, in partial remission: Secondary | ICD-10-CM | POA: Diagnosis not present

## 2020-08-06 DIAGNOSIS — R296 Repeated falls: Secondary | ICD-10-CM | POA: Diagnosis not present

## 2020-08-06 DIAGNOSIS — Z87440 Personal history of urinary (tract) infections: Secondary | ICD-10-CM | POA: Diagnosis not present

## 2020-08-06 DIAGNOSIS — F0391 Unspecified dementia with behavioral disturbance: Secondary | ICD-10-CM

## 2020-08-06 DIAGNOSIS — E782 Mixed hyperlipidemia: Secondary | ICD-10-CM

## 2020-08-06 DIAGNOSIS — R2689 Other abnormalities of gait and mobility: Secondary | ICD-10-CM | POA: Diagnosis not present

## 2020-08-06 DIAGNOSIS — M1380 Other specified arthritis, unspecified site: Secondary | ICD-10-CM | POA: Diagnosis not present

## 2020-08-06 DIAGNOSIS — F29 Unspecified psychosis not due to a substance or known physiological condition: Secondary | ICD-10-CM | POA: Diagnosis not present

## 2020-08-06 DIAGNOSIS — R531 Weakness: Secondary | ICD-10-CM | POA: Diagnosis not present

## 2020-08-07 LAB — COMPREHENSIVE METABOLIC PANEL
ALT: 20 IU/L (ref 0–32)
AST: 20 IU/L (ref 0–40)
Albumin/Globulin Ratio: 1.7 (ref 1.2–2.2)
Albumin: 4.2 g/dL (ref 3.6–4.6)
Alkaline Phosphatase: 90 IU/L (ref 44–121)
BUN/Creatinine Ratio: 23 (ref 12–28)
BUN: 27 mg/dL (ref 8–27)
Bilirubin Total: 0.4 mg/dL (ref 0.0–1.2)
CO2: 21 mmol/L (ref 20–29)
Calcium: 10.3 mg/dL (ref 8.7–10.3)
Chloride: 104 mmol/L (ref 96–106)
Creatinine, Ser: 1.18 mg/dL — ABNORMAL HIGH (ref 0.57–1.00)
Globulin, Total: 2.5 g/dL (ref 1.5–4.5)
Glucose: 149 mg/dL — ABNORMAL HIGH (ref 65–99)
Potassium: 4.5 mmol/L (ref 3.5–5.2)
Sodium: 143 mmol/L (ref 134–144)
Total Protein: 6.7 g/dL (ref 6.0–8.5)
eGFR: 46 mL/min/{1.73_m2} — ABNORMAL LOW (ref >59)

## 2020-08-07 LAB — CBC WITH DIFFERENTIAL/PLATELET
Basophils Absolute: 0.1 10*3/uL (ref 0.0–0.2)
Basos: 1 %
EOS (ABSOLUTE): 0.2 10*3/uL (ref 0.0–0.4)
Eos: 3 %
Hematocrit: 40.4 % (ref 34.0–46.6)
Hemoglobin: 13.8 g/dL (ref 11.1–15.9)
Immature Grans (Abs): 0 10*3/uL (ref 0.0–0.1)
Immature Granulocytes: 0 %
Lymphocytes Absolute: 1.4 10*3/uL (ref 0.7–3.1)
Lymphs: 21 %
MCH: 30.1 pg (ref 26.6–33.0)
MCHC: 34.2 g/dL (ref 31.5–35.7)
MCV: 88 fL (ref 79–97)
Monocytes Absolute: 0.8 10*3/uL (ref 0.1–0.9)
Monocytes: 12 %
Neutrophils Absolute: 4.3 10*3/uL (ref 1.4–7.0)
Neutrophils: 63 %
Platelets: 222 10*3/uL (ref 150–450)
RBC: 4.58 x10E6/uL (ref 3.77–5.28)
RDW: 12 % (ref 11.7–15.4)
WBC: 6.8 10*3/uL (ref 3.4–10.8)

## 2020-08-08 DIAGNOSIS — F29 Unspecified psychosis not due to a substance or known physiological condition: Secondary | ICD-10-CM | POA: Diagnosis not present

## 2020-08-08 DIAGNOSIS — M1380 Other specified arthritis, unspecified site: Secondary | ICD-10-CM | POA: Diagnosis not present

## 2020-08-08 DIAGNOSIS — Z87440 Personal history of urinary (tract) infections: Secondary | ICD-10-CM | POA: Diagnosis not present

## 2020-08-08 DIAGNOSIS — I10 Essential (primary) hypertension: Secondary | ICD-10-CM | POA: Diagnosis not present

## 2020-08-08 DIAGNOSIS — R2689 Other abnormalities of gait and mobility: Secondary | ICD-10-CM | POA: Diagnosis not present

## 2020-08-08 DIAGNOSIS — F3341 Major depressive disorder, recurrent, in partial remission: Secondary | ICD-10-CM | POA: Diagnosis not present

## 2020-08-08 DIAGNOSIS — G3183 Dementia with Lewy bodies: Secondary | ICD-10-CM | POA: Diagnosis not present

## 2020-08-08 DIAGNOSIS — R296 Repeated falls: Secondary | ICD-10-CM | POA: Diagnosis not present

## 2020-08-11 DIAGNOSIS — F3341 Major depressive disorder, recurrent, in partial remission: Secondary | ICD-10-CM | POA: Diagnosis not present

## 2020-08-11 DIAGNOSIS — I10 Essential (primary) hypertension: Secondary | ICD-10-CM | POA: Diagnosis not present

## 2020-08-11 DIAGNOSIS — Z87440 Personal history of urinary (tract) infections: Secondary | ICD-10-CM | POA: Diagnosis not present

## 2020-08-11 DIAGNOSIS — G3183 Dementia with Lewy bodies: Secondary | ICD-10-CM | POA: Diagnosis not present

## 2020-08-11 DIAGNOSIS — R2689 Other abnormalities of gait and mobility: Secondary | ICD-10-CM | POA: Diagnosis not present

## 2020-08-11 DIAGNOSIS — F29 Unspecified psychosis not due to a substance or known physiological condition: Secondary | ICD-10-CM | POA: Diagnosis not present

## 2020-08-11 DIAGNOSIS — M1380 Other specified arthritis, unspecified site: Secondary | ICD-10-CM | POA: Diagnosis not present

## 2020-08-11 DIAGNOSIS — R296 Repeated falls: Secondary | ICD-10-CM | POA: Diagnosis not present

## 2020-08-13 DIAGNOSIS — G3183 Dementia with Lewy bodies: Secondary | ICD-10-CM | POA: Diagnosis not present

## 2020-08-13 DIAGNOSIS — F29 Unspecified psychosis not due to a substance or known physiological condition: Secondary | ICD-10-CM | POA: Diagnosis not present

## 2020-08-13 DIAGNOSIS — R296 Repeated falls: Secondary | ICD-10-CM | POA: Diagnosis not present

## 2020-08-13 DIAGNOSIS — Z87440 Personal history of urinary (tract) infections: Secondary | ICD-10-CM | POA: Diagnosis not present

## 2020-08-13 DIAGNOSIS — F3341 Major depressive disorder, recurrent, in partial remission: Secondary | ICD-10-CM | POA: Diagnosis not present

## 2020-08-13 DIAGNOSIS — R2689 Other abnormalities of gait and mobility: Secondary | ICD-10-CM | POA: Diagnosis not present

## 2020-08-13 DIAGNOSIS — I10 Essential (primary) hypertension: Secondary | ICD-10-CM | POA: Diagnosis not present

## 2020-08-13 DIAGNOSIS — M1380 Other specified arthritis, unspecified site: Secondary | ICD-10-CM | POA: Diagnosis not present

## 2020-08-14 DIAGNOSIS — F3341 Major depressive disorder, recurrent, in partial remission: Secondary | ICD-10-CM | POA: Diagnosis not present

## 2020-08-14 DIAGNOSIS — R2689 Other abnormalities of gait and mobility: Secondary | ICD-10-CM | POA: Diagnosis not present

## 2020-08-14 DIAGNOSIS — G3183 Dementia with Lewy bodies: Secondary | ICD-10-CM | POA: Diagnosis not present

## 2020-08-14 DIAGNOSIS — M1380 Other specified arthritis, unspecified site: Secondary | ICD-10-CM | POA: Diagnosis not present

## 2020-08-14 DIAGNOSIS — I10 Essential (primary) hypertension: Secondary | ICD-10-CM | POA: Diagnosis not present

## 2020-08-14 DIAGNOSIS — F29 Unspecified psychosis not due to a substance or known physiological condition: Secondary | ICD-10-CM | POA: Diagnosis not present

## 2020-08-14 DIAGNOSIS — Z87440 Personal history of urinary (tract) infections: Secondary | ICD-10-CM | POA: Diagnosis not present

## 2020-08-14 DIAGNOSIS — R296 Repeated falls: Secondary | ICD-10-CM | POA: Diagnosis not present

## 2020-08-19 DIAGNOSIS — F3341 Major depressive disorder, recurrent, in partial remission: Secondary | ICD-10-CM | POA: Diagnosis not present

## 2020-08-19 DIAGNOSIS — G3183 Dementia with Lewy bodies: Secondary | ICD-10-CM | POA: Diagnosis not present

## 2020-08-19 DIAGNOSIS — F29 Unspecified psychosis not due to a substance or known physiological condition: Secondary | ICD-10-CM | POA: Diagnosis not present

## 2020-08-19 DIAGNOSIS — I10 Essential (primary) hypertension: Secondary | ICD-10-CM | POA: Diagnosis not present

## 2020-08-19 DIAGNOSIS — Z87440 Personal history of urinary (tract) infections: Secondary | ICD-10-CM | POA: Diagnosis not present

## 2020-08-19 DIAGNOSIS — R296 Repeated falls: Secondary | ICD-10-CM | POA: Diagnosis not present

## 2020-08-19 DIAGNOSIS — R2689 Other abnormalities of gait and mobility: Secondary | ICD-10-CM | POA: Diagnosis not present

## 2020-08-19 DIAGNOSIS — M1380 Other specified arthritis, unspecified site: Secondary | ICD-10-CM | POA: Diagnosis not present

## 2020-08-20 DIAGNOSIS — G3183 Dementia with Lewy bodies: Secondary | ICD-10-CM | POA: Diagnosis not present

## 2020-08-20 DIAGNOSIS — M1380 Other specified arthritis, unspecified site: Secondary | ICD-10-CM | POA: Diagnosis not present

## 2020-08-20 DIAGNOSIS — Z87440 Personal history of urinary (tract) infections: Secondary | ICD-10-CM | POA: Diagnosis not present

## 2020-08-20 DIAGNOSIS — F29 Unspecified psychosis not due to a substance or known physiological condition: Secondary | ICD-10-CM | POA: Diagnosis not present

## 2020-08-20 DIAGNOSIS — F3341 Major depressive disorder, recurrent, in partial remission: Secondary | ICD-10-CM | POA: Diagnosis not present

## 2020-08-20 DIAGNOSIS — R2689 Other abnormalities of gait and mobility: Secondary | ICD-10-CM | POA: Diagnosis not present

## 2020-08-20 DIAGNOSIS — R296 Repeated falls: Secondary | ICD-10-CM | POA: Diagnosis not present

## 2020-08-20 DIAGNOSIS — I10 Essential (primary) hypertension: Secondary | ICD-10-CM | POA: Diagnosis not present

## 2020-08-21 DIAGNOSIS — Z87440 Personal history of urinary (tract) infections: Secondary | ICD-10-CM | POA: Diagnosis not present

## 2020-08-21 DIAGNOSIS — R2689 Other abnormalities of gait and mobility: Secondary | ICD-10-CM | POA: Diagnosis not present

## 2020-08-21 DIAGNOSIS — F29 Unspecified psychosis not due to a substance or known physiological condition: Secondary | ICD-10-CM | POA: Diagnosis not present

## 2020-08-21 DIAGNOSIS — G3183 Dementia with Lewy bodies: Secondary | ICD-10-CM | POA: Diagnosis not present

## 2020-08-21 DIAGNOSIS — F3341 Major depressive disorder, recurrent, in partial remission: Secondary | ICD-10-CM | POA: Diagnosis not present

## 2020-08-21 DIAGNOSIS — R296 Repeated falls: Secondary | ICD-10-CM | POA: Diagnosis not present

## 2020-08-21 DIAGNOSIS — I10 Essential (primary) hypertension: Secondary | ICD-10-CM | POA: Diagnosis not present

## 2020-08-21 DIAGNOSIS — M1380 Other specified arthritis, unspecified site: Secondary | ICD-10-CM | POA: Diagnosis not present

## 2020-08-24 NOTE — Progress Notes (Incomplete)
Subjective:  Patient ID: Michele Parker, female    DOB: May 10, 1941  Age: 80 y.o. MRN: [id number]  Chief Complaint  Patient presents with  . Hyperlipidemia  . Hypertension    HPI Essential hypertension Taking benazepril-hctz 20/12.5 mg one twice a day.  Moderate dementia with behavioral disturbance (HCC) Taking prozac. Decreased falling, continued hallucinations. PT is helping. NO bladder sxs On wellbutrin xl, prozac, Hypothyroidism-Taking synthroid   Home PT 3 x per week. Skilled nursing once a week.   Current Outpatient Medications on File Prior to Visit  Medication Sig Dispense Refill  . benazepril-hydrochlorthiazide (LOTENSIN HCT) 20-12.5 MG tablet Take 1 tablet by mouth 2 (two) times daily. 14 tablet 0  . buPROPion (WELLBUTRIN XL) 150 MG 24 hr tablet Take 150 mg by mouth in the morning.     . cetirizine (ZYRTEC) 10 MG tablet Take 10 mg by mouth daily.    . Cholecalciferol (VITAMIN D3) 25 MCG (1000 UT) CAPS Take 1,000 Units by mouth.     . famotidine (PEPCID) 20 MG tablet Take 20 mg by mouth daily as needed.     [name] FLUoxetine (PROZAC) 10 MG capsule Take 20 mg by mouth daily.     Javier Docker Oil 1000 MG CAPS Take 1 capsule by mouth daily.    . Menaquinone-7 (VITAMIN K2 PO) Take 1 tablet by mouth daily.    . Multiple Vitamin (MULTIVITAMIN) capsule Take 1 capsule by mouth daily.    [name] OLANZapine (ZYPREXA) 5 MG tablet Take 5 mg by mouth at bedtime.     . potassium chloride (MICRO-K) 10 MEQ CR capsule Take 1 capsule (10 mEq total) by mouth daily. 90 capsule 1  . SYNTHROID 50 MCG tablet TAKE 1 TABLET DAILY BEFORE BREAKFAST. 90 tablet 1   No current facility-administered medications on file prior to visit.   Past Medical History:  Diagnosis Date  . Angioedema   . Bradycardia   . Cervicalgia   . Depression   . Essential hypertension   . Hemochromatosis   . Hemorrhoid   . Hereditary hemochromatosis ([place])   . HTN (hypertension)   . Hypercalcemia   . Hyperlipidemia   .  Hypothyroid   . Memory loss   . Osteopenia   . Osteopenia   . Other amnesia   . Other constipation   . Repeated falls   . Tremor   . Vitamin D deficiency    Past Surgical History:  Procedure Laterality Date  . CHOLECYSTECTOMY    . KNEE SURGERY    . MINOR HEMORRHOIDECTOMY    . SHOULDER SURGERY    . TUBAL LIGATION      Family History  Problem Relation Age of Onset  . Heart attack Mother   . Heart disease Mother   . CAD Brother   . Alcohol abuse Brother   . Other Father        unsure of medical hx - died in MVA when patient was 80 years old   Social History   Socioeconomic History  . Marital status: Married    Spouse name: Not on file  . Number of children: 2  . Years of education: 33  . Highest education level: High school graduate  Occupational History  . Occupation: Retired-hosiery  Tobacco Use  . Smoking status: Never Smoker  . Smokeless tobacco: Never Used  Vaping Use  . Vaping Use: Never used  Substance and Sexual Activity  . Alcohol use: No  . Drug use: No  .  Sexual activity: Not on file  Other Topics Concern  . Not on file  Social History Narrative   Lives at home with her husband.   Left-handed.   2 cups caffeine per day.   Social Determinants of Health   Financial Resource Strain: Not on file  Food Insecurity: No Food Insecurity  . Worried About Charity fundraiser in the Last Year: Never true  . Ran Out of Food in the Last Year: Never true  Transportation Needs: No Transportation Needs  . Lack of Transportation (Medical): No  . Lack of Transportation (Non-Medical): No  Physical Activity: Not on file  Stress: Not on file  Social Connections: Not on file    Review of Systems  Constitutional: Negative for chills, fatigue and fever.  HENT: Negative for congestion, ear pain, rhinorrhea and sore throat.   Respiratory: Negative for cough and shortness of breath.   Cardiovascular: Negative for chest pain.  Gastrointestinal: Negative for abdominal  pain, constipation, diarrhea, nausea and vomiting.  Genitourinary: Negative for dysuria and urgency.  Musculoskeletal: Negative for back pain and myalgias.  Neurological: Positive for weakness and light-headedness. Negative for dizziness and headaches.  Psychiatric/Behavioral: Negative for dysphoric mood. The patient is not nervous/anxious.      Objective:  BP 104/62   Pulse 72   Temp (!) 96.8 F (36 C)   Resp 16   Ht 5\' 1"  (1.549 m)   Wt 147 lb (66.7 kg)   BMI 27.78 kg/m   BP/Weight 08/06/2020 06/23/2020 81/05/9145  Systolic BP 829 562 130  Diastolic BP 62 60 60  Wt. (Lbs) 147 - 145.2  BMI 27.78 - 27.44    Physical Exam Vitals reviewed.  Constitutional:      Appearance: Normal appearance. She is normal weight.  Neck:     Vascular: No carotid bruit.  Cardiovascular:     Rate and Rhythm: Normal rate and regular rhythm.     Pulses: Normal pulses.     Heart sounds: Normal heart sounds.  Pulmonary:     Effort: Pulmonary effort is normal. No respiratory distress.     Breath sounds: Normal breath sounds.  Abdominal:     General: Abdomen is flat. Bowel sounds are normal.     Palpations: Abdomen is soft.     Tenderness: There is no abdominal tenderness.  Neurological:     Mental Status: She is alert and oriented to person, place, and time.  Psychiatric:        Mood and Affect: Mood normal.        Behavior: Behavior normal.     Diabetic Foot Exam - Simple   No data filed      Lab Results  Component Value Date   WBC 5.0 06/23/2020   HGB 14.5 06/23/2020   HCT 42.6 06/23/2020   PLT 201 06/23/2020   GLUCOSE 146 (H) 06/23/2020   CHOL 238 (H) 08/14/2019   TRIG 156 (H) 08/14/2019   HDL 70 08/14/2019   LDLCALC 141 (H) 08/14/2019   ALT 35 (H) 06/23/2020   AST 39 06/23/2020   NA 132 (L) 06/23/2020   K 3.8 06/23/2020   CL 94 (L) 06/23/2020   CREATININE 1.39 (H) 06/23/2020   BUN 26 06/23/2020   CO2 23 06/23/2020   TSH 2.490 05/05/2020      Assessment & Plan:    1. Essential hypertension  2. Mixed hyperlipidemia  3. Moderate dementia with behavioral disturbance ([place])  4. Hypothyroidism, unspecified type    No  orders of the defined types were placed in this encounter.   No orders of the defined types were placed in this encounter.     I spent < time > minutes dedicated to the care of this patient on the date of this encounter to include face-to-face time with the patient, as well as: ***  Follow-up: No follow-ups on file.  An After Visit Summary was printed and given to the patient.  [name], MD [name] [phone]

## 2020-08-25 ENCOUNTER — Encounter: Payer: Self-pay | Admitting: Family Medicine

## 2020-08-26 ENCOUNTER — Telehealth: Payer: Self-pay

## 2020-08-26 DIAGNOSIS — R296 Repeated falls: Secondary | ICD-10-CM | POA: Diagnosis not present

## 2020-08-26 DIAGNOSIS — F29 Unspecified psychosis not due to a substance or known physiological condition: Secondary | ICD-10-CM | POA: Diagnosis not present

## 2020-08-26 DIAGNOSIS — F3341 Major depressive disorder, recurrent, in partial remission: Secondary | ICD-10-CM | POA: Diagnosis not present

## 2020-08-26 DIAGNOSIS — M1380 Other specified arthritis, unspecified site: Secondary | ICD-10-CM | POA: Diagnosis not present

## 2020-08-26 DIAGNOSIS — I10 Essential (primary) hypertension: Secondary | ICD-10-CM | POA: Diagnosis not present

## 2020-08-26 DIAGNOSIS — Z87440 Personal history of urinary (tract) infections: Secondary | ICD-10-CM | POA: Diagnosis not present

## 2020-08-26 DIAGNOSIS — R2689 Other abnormalities of gait and mobility: Secondary | ICD-10-CM | POA: Diagnosis not present

## 2020-08-26 DIAGNOSIS — F0390 Unspecified dementia without behavioral disturbance: Secondary | ICD-10-CM | POA: Diagnosis not present

## 2020-08-26 DIAGNOSIS — R251 Tremor, unspecified: Secondary | ICD-10-CM | POA: Diagnosis not present

## 2020-08-26 DIAGNOSIS — G3183 Dementia with Lewy bodies: Secondary | ICD-10-CM | POA: Diagnosis not present

## 2020-08-26 NOTE — Progress Notes (Signed)
    Chronic Care Management Pharmacy Assistant   Name: Michele Parker  MRN: [id number] DOB: Aug 27, 1940   Reason for Encounter: Adherence review    Medications: Outpatient Encounter Medications as of 08/26/2020  Medication Sig  . benazepril-hydrochlorthiazide (LOTENSIN HCT) 20-12.5 MG tablet Take 1 tablet by mouth 2 (two) times daily.  [name] buPROPion (WELLBUTRIN XL) 150 MG 24 hr tablet Take 150 mg by mouth in the morning.   . cetirizine (ZYRTEC) 10 MG tablet Take 10 mg by mouth daily.  . Cholecalciferol (VITAMIN D3) 25 MCG (1000 UT) CAPS Take 1,000 Units by mouth.   . famotidine (PEPCID) 20 MG tablet Take 20 mg by mouth daily as needed.   [name] FLUoxetine (PROZAC) 10 MG capsule Take 20 mg by mouth daily.   Javier Docker Oil 1000 MG CAPS Take 1 capsule by mouth daily.  . Menaquinone-7 (VITAMIN K2 PO) Take 1 tablet by mouth daily.  . Multiple Vitamin (MULTIVITAMIN) capsule Take 1 capsule by mouth daily.  [name] OLANZapine (ZYPREXA) 5 MG tablet Take 5 mg by mouth at bedtime.   . potassium chloride (MICRO-K) 10 MEQ CR capsule Take 1 capsule (10 mEq total) by mouth daily.  [name] SYNTHROID 50 MCG tablet TAKE 1 TABLET DAILY BEFORE BREAKFAST.   No facility-administered encounter medications on file as of 08/26/2020.    Chart review noted the patient has appointments on: 11/04/20-Sara Owens Shark, [place] 11/18/20-Dr. Tobie Poet, PCP  Care Gaps Overdue     Jul 18 2018 MAMMOGRAM (Yearly) Last completed: Jul 18, 2017   [place] 9  2021 DEXA SCAN (Every 2 Years) Last completed: May 08, 2018    Medication adherence Not available   Clarita Leber, [place] Pharmacist Assistant [phone]

## 2020-08-28 DIAGNOSIS — I10 Essential (primary) hypertension: Secondary | ICD-10-CM | POA: Diagnosis not present

## 2020-08-28 DIAGNOSIS — G3183 Dementia with Lewy bodies: Secondary | ICD-10-CM | POA: Diagnosis not present

## 2020-08-28 DIAGNOSIS — Z87440 Personal history of urinary (tract) infections: Secondary | ICD-10-CM | POA: Diagnosis not present

## 2020-08-28 DIAGNOSIS — F3341 Major depressive disorder, recurrent, in partial remission: Secondary | ICD-10-CM | POA: Diagnosis not present

## 2020-08-28 DIAGNOSIS — F29 Unspecified psychosis not due to a substance or known physiological condition: Secondary | ICD-10-CM | POA: Diagnosis not present

## 2020-08-28 DIAGNOSIS — R2689 Other abnormalities of gait and mobility: Secondary | ICD-10-CM | POA: Diagnosis not present

## 2020-08-28 DIAGNOSIS — M1380 Other specified arthritis, unspecified site: Secondary | ICD-10-CM | POA: Diagnosis not present

## 2020-08-28 DIAGNOSIS — R296 Repeated falls: Secondary | ICD-10-CM | POA: Diagnosis not present

## 2020-09-03 DIAGNOSIS — I10 Essential (primary) hypertension: Secondary | ICD-10-CM | POA: Diagnosis not present

## 2020-09-03 DIAGNOSIS — F3341 Major depressive disorder, recurrent, in partial remission: Secondary | ICD-10-CM | POA: Diagnosis not present

## 2020-09-03 DIAGNOSIS — F29 Unspecified psychosis not due to a substance or known physiological condition: Secondary | ICD-10-CM | POA: Diagnosis not present

## 2020-09-03 DIAGNOSIS — R296 Repeated falls: Secondary | ICD-10-CM | POA: Diagnosis not present

## 2020-09-03 DIAGNOSIS — R2689 Other abnormalities of gait and mobility: Secondary | ICD-10-CM | POA: Diagnosis not present

## 2020-09-03 DIAGNOSIS — Z87440 Personal history of urinary (tract) infections: Secondary | ICD-10-CM | POA: Diagnosis not present

## 2020-09-03 DIAGNOSIS — M1380 Other specified arthritis, unspecified site: Secondary | ICD-10-CM | POA: Diagnosis not present

## 2020-09-03 DIAGNOSIS — G3183 Dementia with Lewy bodies: Secondary | ICD-10-CM | POA: Diagnosis not present

## 2020-09-05 DIAGNOSIS — F0390 Unspecified dementia without behavioral disturbance: Secondary | ICD-10-CM | POA: Diagnosis not present

## 2020-09-15 DIAGNOSIS — F0390 Unspecified dementia without behavioral disturbance: Secondary | ICD-10-CM | POA: Diagnosis not present

## 2020-09-29 DIAGNOSIS — R059 Cough, unspecified: Secondary | ICD-10-CM | POA: Diagnosis not present

## 2020-09-29 DIAGNOSIS — R0602 Shortness of breath: Secondary | ICD-10-CM | POA: Diagnosis not present

## 2020-09-29 DIAGNOSIS — R079 Chest pain, unspecified: Secondary | ICD-10-CM | POA: Diagnosis not present

## 2020-09-30 ENCOUNTER — Ambulatory Visit (INDEPENDENT_AMBULATORY_CARE_PROVIDER_SITE_OTHER): Payer: Medicare HMO | Admitting: Nurse Practitioner

## 2020-09-30 ENCOUNTER — Encounter: Payer: Self-pay | Admitting: Nurse Practitioner

## 2020-09-30 ENCOUNTER — Other Ambulatory Visit: Payer: Self-pay

## 2020-09-30 VITALS — BP 102/82 | HR 69 | Temp 97.3°F | Ht 61.0 in | Wt 149.0 lb

## 2020-09-30 DIAGNOSIS — R059 Cough, unspecified: Secondary | ICD-10-CM | POA: Diagnosis not present

## 2020-09-30 DIAGNOSIS — K130 Diseases of lips: Secondary | ICD-10-CM

## 2020-09-30 DIAGNOSIS — K219 Gastro-esophageal reflux disease without esophagitis: Secondary | ICD-10-CM

## 2020-09-30 DIAGNOSIS — J329 Chronic sinusitis, unspecified: Secondary | ICD-10-CM

## 2020-09-30 DIAGNOSIS — J301 Allergic rhinitis due to pollen: Secondary | ICD-10-CM | POA: Diagnosis not present

## 2020-09-30 DIAGNOSIS — R0602 Shortness of breath: Secondary | ICD-10-CM

## 2020-09-30 MED ORDER — FLUTICASONE PROPIONATE 50 MCG/ACT NA SUSP: 2.0000 | Freq: Every day | NASAL | 6 refills | Status: DC

## 2020-09-30 MED ORDER — AZITHROMYCIN 250 MG PO TABS: ORAL_TABLET | ORAL | 0 refills | Status: AC

## 2020-09-30 MED ORDER — PROMETHAZINE-DM 6.25-15 MG/5ML PO SYRP: 5.0000 mL | ORAL_SOLUTION | Freq: Four times a day (QID) | ORAL | 0 refills | Status: DC | PRN

## 2020-09-30 NOTE — Patient Instructions (Addendum)
Resume Pepcid 20 mg daily Begin Flonase nasal spray daily until the end of May Take Z-pak as prescribed Use cough syrup as directed Fall precautions We will call you with lab results and appointment with dermatologist (call office with preference) Return urine sample to office tomorrow Notify office of any side effects of medications Follow up if symptoms fail to improve or worsen    Fall Prevention in the Home, Adult Falls can cause injuries and can happen to people of all ages. There are many things you can do to make your home safe and to help prevent falls. Ask for help when making these changes. What actions can I take to prevent falls? General Instructions  Use good lighting in all rooms. Replace any light bulbs that burn out.  Turn on the lights in dark areas. Use night-lights.  Keep items that you use often in easy-to-reach places. Lower the shelves around your home if needed.  Set up your furniture so you have a clear path. Avoid moving your furniture around.  Do not have throw rugs or other things on the floor that can make you trip.  Avoid walking on wet floors.  If any of your floors are uneven, fix them.  Add color or contrast paint or tape to clearly mark and help you see: ? Grab bars or handrails. ? First and last steps of staircases. ? Where the edge of each step is.  If you use a stepladder: ? Make sure that it is fully opened. Do not climb a closed stepladder. ? Make sure the sides of the stepladder are locked in place. ? Ask someone to hold the stepladder while you use it.  Know where your pets are when moving through your home. What can I do in the bathroom?  Keep the floor dry. Clean up any water on the floor right away.  Remove soap buildup in the tub or shower.  Use nonskid mats or decals on the floor of the tub or shower.  Attach bath mats securely with double-sided, nonslip rug tape.  If you need to sit down in the shower, use a plastic,  nonslip stool.  Install grab bars by the toilet and in the tub and shower. Do not use towel bars as grab bars.      What can I do in the bedroom?  Make sure that you have a light by your bed that is easy to reach.  Do not use any sheets or blankets for your bed that hang to the floor.  Have a firm chair with side arms that you can use for support when you get dressed. What can I do in the kitchen?  Clean up any spills right away.  If you need to reach something above you, use a step stool with a grab bar.  Keep electrical cords out of the way.  Do not use floor polish or wax that makes floors slippery. What can I do with my stairs?  Do not leave any items on the stairs.  Make sure that you have a light switch at the top and the bottom of the stairs.  Make sure that there are handrails on both sides of the stairs. Fix handrails that are broken or loose.  Install nonslip stair treads on all your stairs.  Avoid having throw rugs at the top or bottom of the stairs.  Choose a carpet that does not hide the edge of the steps on the stairs.  Check carpeting to make  sure that it is firmly attached to the stairs. Fix carpet that is loose or worn. What can I do on the outside of my home?  Use bright outdoor lighting.  Fix the edges of walkways and driveways and fix any cracks.  Remove anything that might make you trip as you walk through a door, such as a raised step or threshold.  Trim any bushes or trees on paths to your home.  Check to see if handrails are loose or broken and that both sides of all steps have handrails.  Install guardrails along the edges of any raised decks and porches.  Clear paths of anything that can make you trip, such as tools or rocks.  Have leaves, snow, or ice cleared regularly.  Use sand or salt on paths during winter.  Clean up any spills in your garage right away. This includes grease or oil spills. What other actions can I take?  Wear  shoes that: ? Have a low heel. Do not wear high heels. ? Have rubber bottoms. ? Feel good on your feet and fit well. ? Are closed at the toe. Do not wear open-toe sandals.  Use tools that help you move around if needed. These include: ? Canes. ? Walkers. ? Scooters. ? Crutches.  Review your medicines with your doctor. Some medicines can make you feel dizzy. This can increase your chance of falling. Ask your doctor what else you can do to help prevent falls. Where to find more information  Centers for Disease Control and Prevention, STEADI: http://www.wolf.info/  National Institute on Aging: http://kim-miller.com/ Contact a doctor if:  You are afraid of falling at home.  You feel weak, drowsy, or dizzy at home.  You fall at home. Summary  There are many simple things that you can do to make your home safe and to help prevent falls.  Ways to make your home safe include removing things that can make you trip and installing grab bars in the bathroom.  Ask for help when making these changes in your home. This information is not intended to replace advice given to you by your health care provider. Make sure you discuss any questions you have with your health care provider. Document Revised: 12/19/2019 Document Reviewed: 12/19/2019 Elsevier Patient Education  2021 [place].  Sinusitis, Adult Sinusitis is soreness and swelling (inflammation) of your sinuses. Sinuses are hollow spaces in the bones around your face. They are located:  Around your eyes.  In the middle of your forehead.  Behind your nose.  In your cheekbones. Your sinuses and nasal passages are lined with a fluid called mucus. Mucus drains out of your sinuses. Swelling can trap mucus in your sinuses. This lets germs (bacteria, virus, or fungus) grow, which leads to infection. Most of the time, this condition is caused by a virus. What are the causes? This condition is caused by:  Allergies.  Asthma.  Germs.  Things  that block your nose or sinuses.  Growths in the nose (nasal polyps).  Chemicals or irritants in the air.  Fungus (rare). What increases the risk? You are more likely to develop this condition if:  You have a weak body defense system (immune system).  You do a lot of swimming or diving.  You use nasal sprays too much.  You smoke. What are the signs or symptoms? The main symptoms of this condition are pain and a feeling of pressure around the sinuses. Other symptoms include:  Stuffy nose (congestion).  Runny  nose (drainage).  Swelling and warmth in the sinuses.  Headache.  Toothache.  A cough that may get worse at night.  Mucus that collects in the throat or the back of the nose (postnasal drip).  Being unable to smell and taste.  Being very tired (fatigue).  A fever.  Sore throat.  Bad breath. How is this diagnosed? This condition is diagnosed based on:  Your symptoms.  Your medical history.  A physical exam.  Tests to find out if your condition is short-term (acute) or long-term (chronic). Your doctor may: ? Check your nose for growths (polyps). ? Check your sinuses using a tool that has a light (endoscope). ? Check for allergies or germs. ? Do imaging tests, such as an MRI or CT scan. How is this treated? Treatment for this condition depends on the cause and whether it is short-term or long-term.  If caused by a virus, your symptoms should go away on their own within 10 days. You may be given medicines to relieve symptoms. They include: ? Medicines that shrink swollen tissue in the nose. ? Medicines that treat allergies (antihistamines). ? A spray that treats swelling of the nostrils. ? Rinses that help get rid of thick mucus in your nose (nasal saline washes).  If caused by bacteria, your doctor may wait to see if you will get better without treatment. You may be given antibiotic medicine if you have: ? A very bad infection. ? A weak body  defense system.  If caused by growths in the nose, you may need to have surgery. Follow these instructions at home: Medicines  Take, use, or apply over-the-counter and prescription medicines only as told by your doctor. These may include nasal sprays.  If you were prescribed an antibiotic medicine, take it as told by your doctor. Do not stop taking the antibiotic even if you start to feel better. Hydrate and humidify  Drink enough water to keep your pee (urine) pale yellow.  Use a cool mist humidifier to keep the humidity level in your home above 50%.  Breathe in steam for 10-15 minutes, 3-4 times a day, or as told by your doctor. You can do this in the bathroom while a hot shower is running.  Try not to spend time in cool or dry air.   Rest  Rest as much as you can.  Sleep with your head raised (elevated).  Make sure you get enough sleep each night. General instructions  Put a warm, moist washcloth on your face 3-4 times a day, or as often as told by your doctor. This will help with discomfort.  Wash your hands often with soap and water. If there is no soap and water, use hand sanitizer.  Do not smoke. Avoid being around people who are smoking (secondhand smoke).  Keep all follow-up visits as told by your doctor. This is important.   Contact a doctor if:  You have a fever.  Your symptoms get worse.  Your symptoms do not get better within 10 days. Get help right away if:  You have a very bad headache.  You cannot stop throwing up (vomiting).  You have very bad pain or swelling around your face or eyes.  You have trouble seeing.  You feel confused.  Your neck is stiff.  You have trouble breathing. Summary  Sinusitis is swelling of your sinuses. Sinuses are hollow spaces in the bones around your face.  This condition is caused by tissues in your nose that  become inflamed or swollen. This traps germs. These can lead to infection.  If you were prescribed an  antibiotic medicine, take it as told by your doctor. Do not stop taking it even if you start to feel better.  Keep all follow-up visits as told by your doctor. This is important. This information is not intended to replace advice given to you by your health care provider. Make sure you discuss any questions you have with your health care provider. Document Revised: 10/17/2017 Document Reviewed: 10/17/2017 Elsevier Patient Education  2021 [place]. Allergic Rhinitis, Adult Allergic rhinitis is a reaction to allergens. Allergens are things that can cause an allergic reaction. This condition affects the lining inside the nose (mucous membrane). There are two types of allergic rhinitis:  Seasonal. This type is also called hay fever. It happens only during some times of the year.  Perennial. This type can happen at any time of the year. This condition cannot be spread from person to person (is not contagious). It can be mild, worse, or very bad. It can develop at any age and may be outgrown. What are the causes? This condition may be caused by:  Pollen from grasses, trees, and weeds.  Dust mites.  Smoke.  Mold.  Car fumes.  The pee (urine), spit, or dander of pets. Dander is dead skin cells from a pet.   What increases the risk? You are more likely to develop this condition if:  You have allergies in your family.  You have problems like allergies in your family. You may have: ? Swelling of parts of your eyes and eyelids. ? Asthma. This affects how you breathe. ? Long-term redness and swelling on your skin. ? Food allergies. What are the signs or symptoms? The main symptom of this condition is a runny or stuffy nose (nasal congestion). Other symptoms may include:  Sneezing or coughing.  Itching and tearing of your eyes.  Mucus that drips down the back of your throat (postnasal drip).  Trouble sleeping.  Feeling tired.  Headache.  Sore throat. How is this  treated? There is no cure for this condition. You should avoid things that you are allergic to. Treatment can help to relieve symptoms. This may include:  Medicines that block allergy symptoms, such as corticosteroids or antihistamines. These may be given as a shot, nasal spray, or pill.  Avoiding things you are allergic to.  Medicines that give you bits of what you are allergic to over time. This is called immunotherapy. It is done if other treatments do not help. You may get: ? Shots. ? Medicine under your tongue.  Stronger medicines, if other treatments do not help. Follow these instructions at home: Avoiding allergens Find out what things you are allergic to and avoid them. To do this, try these things:  If you get allergies any time of year: ? Replace carpet with wood, tile, or vinyl flooring. Carpet can trap pet dander and dust. ? Do not smoke. Do not allow smoking in your home. ? Change your heating and air conditioning filters at least once a month.  If you get allergies only some times of the year: ? Keep windows closed when you can. ? Plan things to do outside when pollen counts are lowest. Check pollen counts before you plan things to do outside. ? When you come indoors, change your clothes and shower before you sit on furniture or bedding.   If you are allergic to a pet: ? Keep the  pet out of your bedroom. ? Vacuum, sweep, and dust often.   General instructions  Take over-the-counter and prescription medicines only as told by your doctor.  Drink enough fluid to keep your pee (urine) pale yellow.  Keep all follow-up visits as told by your doctor. This is important. Where to find more information  American Academy of Allergy, Asthma & Immunology: www.aaaai.org Contact a doctor if:  You have a fever.  You get a cough that does not go away.  You make whistling sounds when you breathe (wheeze).  Your symptoms slow you down.  Your symptoms stop you from doing  your normal things each day. Get help right away if:  You are short of breath. This symptom may be an emergency. Do not wait to see if the symptom will go away. Get medical help right away. Call your local emergency services (911 in the [place] Do not drive yourself to the hospital. Summary  Allergic rhinitis may be treated by taking medicines and avoiding things you are allergic to.  If you have allergies only some of the year, keep windows closed when you can at those times.  Contact your doctor if you get a fever or a cough that does not go away. This information is not intended to replace advice given to you by your health care provider. Make sure you discuss any questions you have with your health care provider. Document Revised: 07/09/2019 Document Reviewed: 05/15/2019 Elsevier Patient Education  2021 [employer].

## 2020-09-30 NOTE — Progress Notes (Signed)
Acute Office Visit  Subjective:    Patient ID: Michele Parker, female    DOB: Sep 05, 1940, 80 y.o.   MRN: [id number]  CC: Cough   HPI Michele Parker is an 80 year old Caucasian female that presents for evaluation of cough and mild dyspnea. Cough is described as "congested"; has productive clear sputum at times. Cough worsens at night. Pt admits to orthopnea.  She is accompanied by her daughter. Onset was approximately 2 weeks ago. Treatment includes prescription cough syrup. Michele Parker has a past medical history of GERD and allergic rhinitis. She is prescribed Pepcid but is not currently taking this medication per daughter. She takes Zyrtec 10 mg daily for allergic rhinitis. CXR on 09/29/20 reveals aortic atherosclerosis but no pneumonia.Michele Parker is not currently on statin therapy per daughter. She has not been seen by cardiology.  Past Medical History:  Diagnosis Date  . Angioedema   . Bradycardia   . Cervicalgia   . Depression   . Essential hypertension   . Hemochromatosis   . Hemorrhoid   . Hereditary hemochromatosis ([place])   . HTN (hypertension)   . Hypercalcemia   . Hyperlipidemia   . Hypothyroid   . Memory loss   . Osteopenia   . Osteopenia   . Other amnesia   . Other constipation   . Repeated falls   . Tremor   . Vitamin D deficiency     Past Surgical History:  Procedure Laterality Date  . CHOLECYSTECTOMY    . KNEE SURGERY    . MINOR HEMORRHOIDECTOMY    . SHOULDER SURGERY    . TUBAL LIGATION      Family History  Problem Relation Age of Onset  . Heart attack Mother   . Heart disease Mother   . CAD Brother   . Alcohol abuse Brother   . Other Father        unsure of medical hx - died in MVA when patient was 80 years old    Social History   Socioeconomic History  . Marital status: Married    Spouse name: Not on file  . Number of children: 2  . Years of education: 80  . Highest education level: High school graduate  Occupational History  . Occupation: Retired-hosiery   Tobacco Use  . Smoking status: Never Smoker  . Smokeless tobacco: Never Used  Vaping Use  . Vaping Use: Never used  Substance and Sexual Activity  . Alcohol use: No  . Drug use: No  . Sexual activity: Not on file  Other Topics Concern  . Not on file  Social History Narrative   Lives at home with her husband.   Left-handed.   2 cups caffeine per day.   Social Determinants of Health   Financial Resource Strain: Not on file  Food Insecurity: No Food Insecurity  . Worried About Charity fundraiser in the Last Year: Never true  . Ran Out of Food in the Last Year: Never true  Transportation Needs: No Transportation Needs  . Lack of Transportation (Medical): No  . Lack of Transportation (Non-Medical): No  Physical Activity: Not on file  Stress: Not on file  Social Connections: Not on file  Intimate Partner Violence: Not on file    Outpatient Medications Prior to Visit  Medication Sig Dispense Refill  . benazepril-hydrochlorthiazide (LOTENSIN HCT) 20-12.5 MG tablet Take 1 tablet by mouth 2 (two) times daily. 14 tablet 0  . buPROPion (WELLBUTRIN XL) 150 MG 24 hr tablet Take 150 mg by  mouth in the morning.     . cetirizine (ZYRTEC) 10 MG tablet Take 10 mg by mouth daily.    . Cholecalciferol (VITAMIN D3) 25 MCG (1000 UT) CAPS Take 1,000 Units by mouth.     . famotidine (PEPCID) 20 MG tablet Take 20 mg by mouth daily as needed.     [name] FLUoxetine (PROZAC) 10 MG capsule Take 20 mg by mouth daily.     Javier Docker Oil 1000 MG CAPS Take 1 capsule by mouth daily.    . Menaquinone-7 (VITAMIN K2 PO) Take 1 tablet by mouth daily.    . Multiple Vitamin (MULTIVITAMIN) capsule Take 1 capsule by mouth daily.    [name] OLANZapine (ZYPREXA) 5 MG tablet Take 5 mg by mouth at bedtime.     . potassium chloride (MICRO-K) 10 MEQ CR capsule Take 1 capsule (10 mEq total) by mouth daily. 90 capsule 1  . SYNTHROID 50 MCG tablet TAKE 1 TABLET DAILY BEFORE BREAKFAST. 90 tablet 1   No facility-administered  medications prior to visit.    Allergies  Allergen Reactions  . Bystolic [Nebivolol Hcl] Other (See Comments)    Bradycardia  . Aricept [Donepezil]     Nausea and vomiting.  . Lisinopril Swelling  . Other     Other reaction(s): GI Upset (intolerance)  . Oxycodone     rash    Review of Systems  Constitutional: Negative for appetite change, fatigue and unexpected weight change.  HENT: Positive for rhinorrhea and sinus pressure. Negative for congestion, ear pain, sinus pain and tinnitus.   Eyes: Negative for pain.  Respiratory: Positive for cough and shortness of breath.   Cardiovascular: Negative for chest pain, palpitations and leg swelling.  Gastrointestinal: Negative for abdominal pain, constipation, diarrhea, nausea and vomiting.  Endocrine: Negative for cold intolerance, heat intolerance, polydipsia, polyphagia and polyuria.  Genitourinary: Negative for dysuria, frequency and hematuria.  Musculoskeletal: Negative for arthralgias, back pain, joint swelling and myalgias.  Skin: Negative for rash.  Allergic/Immunologic: Negative for environmental allergies.  Neurological: Positive for headaches. Negative for dizziness.  Hematological: Negative for adenopathy.  Psychiatric/Behavioral: Negative for decreased concentration and sleep disturbance. The patient is not nervous/anxious.        Objective:    Physical Exam Vitals reviewed.  Constitutional:      Appearance: Normal appearance.  HENT:     Head: Normocephalic.     Right Ear: Tympanic membrane normal.     Left Ear: Tympanic membrane normal.     Nose: Congestion and rhinorrhea present.     Mouth/Throat:     Lips: Lesions present.     Mouth: Mucous membranes are moist.     Pharynx: Posterior oropharyngeal erythema present.      Comments: Black lesion approximately 0.5 mm in diameter to right bottom lip without drainage. Cardiovascular:     Rate and Rhythm: Normal rate and regular rhythm.     Pulses: Normal pulses.      Heart sounds: Normal heart sounds.  Pulmonary:     Effort: Pulmonary effort is normal.     Breath sounds: Normal breath sounds.     Comments: Congested cough noted Abdominal:     General: Bowel sounds are normal.     Palpations: Abdomen is soft.  Musculoskeletal:        General: Normal range of motion.     Cervical back: Neck supple.  Skin:    General: Skin is warm and dry.     Capillary Refill: Capillary refill takes  less than 2 seconds.     Findings: Lesion present.  Neurological:     Mental Status: She is alert and oriented to person, place, and time. Mental status is at baseline.  Psychiatric:        Mood and Affect: Mood normal.        Behavior: Behavior normal.     BP 102/82 (BP Location: Left Arm, Patient Position: Sitting)   Pulse 69   Temp (!) 97.3 F (36.3 C) (Temporal)   Ht $R'5\' 1"'kB$  (1.549 m)   Wt 149 lb (67.6 kg)   SpO2 96%   BMI 28.15 kg/m  Wt Readings from Last 3 Encounters:  08/06/20 147 lb (66.7 kg)  05/05/20 145 lb 3.2 oz (65.9 kg)  02/08/20 150 lb (68 kg)    Health Maintenance Due  Topic Date Due  . COVID-19 Vaccine (1) Never done  . MAMMOGRAM  07/18/2018  . DEXA SCAN  05/08/2020      Lab Results  Component Value Date   TSH 2.490 05/05/2020   Lab Results  Component Value Date   WBC 6.8 08/06/2020   HGB 13.8 08/06/2020   HCT 40.4 08/06/2020   MCV 88 08/06/2020   PLT 222 08/06/2020   Lab Results  Component Value Date   NA 143 08/06/2020   K 4.5 08/06/2020   CO2 21 08/06/2020   GLUCOSE 149 (H) 08/06/2020   BUN 27 08/06/2020   CREATININE 1.18 (H) 08/06/2020   BILITOT 0.4 08/06/2020   ALKPHOS 90 08/06/2020   AST 20 08/06/2020   ALT 20 08/06/2020   PROT 6.7 08/06/2020   ALBUMIN 4.2 08/06/2020   CALCIUM 10.3 08/06/2020   EGFR 46 (L) 08/06/2020   GFR 89.64 07/20/2013   Lab Results  Component Value Date   CHOL 238 (H) 08/14/2019   Lab Results  Component Value Date   HDL 70 08/14/2019   Lab Results  Component Value Date    LDLCALC 141 (H) 08/14/2019   Lab Results  Component Value Date   TRIG 156 (H) 08/14/2019   Lab Results  Component Value Date   CHOLHDL 3.4 08/14/2019        Assessment & Plan:   1. Cough - promethazine-dextromethorphan (PROMETHAZINE-DM) 6.25-15 MG/5ML syrup; Take 5 mLs by mouth 4 (four) times daily as needed for cough.  Dispense: 118 mL; Refill: 0 - Pro b natriuretic peptide -Fall precautions with cough medication: Change positions slowly, have well lit walkways, remove and tripping hazards such as rugs, cords, etc.   2. Seasonal allergic rhinitis due to pollen - fluticasone (FLONASE) 50 MCG/ACT nasal spray; Place 2 sprays into both nostrils daily.  Dispense: 16 g; Refill: 6 -Continue Zyrtec 10 mg daily  3. Other sinusitis, unspecified chronicity - azithromycin (ZITHROMAX) 250 MG tablet; Take 2 tablets on day 1, then 1 tablet daily on days 2 through 5  Dispense: 6 tablet; Refill: 0  4. Lip lesion - Ambulatory referral to Dermatology  5. GERD without esophagitis -Resume Pepcid 20 mg daily    Resume Pepcid 20 mg daily Begin Flonase nasal spray daily until the end of May Take Z-pak as prescribed Use cough syrup as directed Fall precautions We will call you with lab results and appointment with dermatologist (call office with preference) Return urine sample to office tomorrow Notify office of any side effects of medications Follow up if symptoms fail to improve or worsen  Follow-up: As needed, if symptoms fail to improve or worsen  Signed, Rip Harbour,  NP

## 2020-10-01 LAB — PRO B NATRIURETIC PEPTIDE: NT-Pro BNP: 98 pg/mL (ref 0–738)

## 2020-10-07 NOTE — Addendum Note (Signed)
Addended by: Oleta Mouse on: 10/07/2020 09:54 AM   Modules accepted: Orders

## 2020-10-21 ENCOUNTER — Other Ambulatory Visit: Payer: Self-pay

## 2020-10-21 ENCOUNTER — Ambulatory Visit (INDEPENDENT_AMBULATORY_CARE_PROVIDER_SITE_OTHER): Payer: Medicare HMO

## 2020-10-21 ENCOUNTER — Other Ambulatory Visit: Payer: Self-pay | Admitting: Family Medicine

## 2020-10-21 DIAGNOSIS — N3000 Acute cystitis without hematuria: Secondary | ICD-10-CM | POA: Diagnosis not present

## 2020-10-21 LAB — POCT URINALYSIS DIP (CLINITEK)
Bilirubin, UA: NEGATIVE
Blood, UA: NEGATIVE
Glucose, UA: NEGATIVE mg/dL
Ketones, POC UA: NEGATIVE mg/dL
Nitrite, UA: NEGATIVE
POC PROTEIN,UA: NEGATIVE
Spec Grav, UA: 1.015 (ref 1.010–1.025)
Urobilinogen, UA: 0.2 E.U./dL
pH, UA: 6 (ref 5.0–8.0)

## 2020-10-21 MED ORDER — NITROFURANTOIN MONOHYD MACRO 100 MG PO CAPS: 100.0000 mg | ORAL_CAPSULE | Freq: Two times a day (BID) | ORAL | 0 refills | Status: DC

## 2020-10-21 MED ORDER — CIPROFLOXACIN HCL 250 MG PO TABS: 250.0000 mg | ORAL_TABLET | Freq: Two times a day (BID) | ORAL | 0 refills | Status: AC

## 2020-10-21 NOTE — Progress Notes (Signed)
      Patient's daughter brought in urine sample requesting it be tested and cultured due to increased confusion.  She would like ABT sent into [facility].  Shelle Iron, LPN 90/12/22 4:11 PM

## 2020-10-22 ENCOUNTER — Other Ambulatory Visit: Payer: Self-pay | Admitting: Family Medicine

## 2020-10-22 ENCOUNTER — Other Ambulatory Visit: Payer: Self-pay | Admitting: Physician Assistant

## 2020-10-22 DIAGNOSIS — I10 Essential (primary) hypertension: Secondary | ICD-10-CM

## 2020-10-23 LAB — URINE CULTURE

## 2020-11-04 ENCOUNTER — Telehealth: Payer: Medicare HMO

## 2020-11-07 ENCOUNTER — Telehealth: Payer: Self-pay

## 2020-11-07 NOTE — Progress Notes (Signed)
Chronic Care Management Pharmacy Assistant   Name: Michele Parker  MRN: [id number] DOB: [birth year]   Reason for Encounter: Disease State for hypertension  Recent office visits:  11/25/20-Dr [name] PCP, dementia with behavioral disturbance, Labs, results not available, no medication changes. No follow up listed.   10/21/20-Kim Smith LPN, acute cystitis, Patient's daughter brought in urine sample requesting it be tested and cultured due to increased confusion.  She would like ABT sent into [facility]  Recent consult visits:  418/22-Neurology, At last visit we discussed the DDX of SDAT or LBD. The patient was started on Exelon. Patient did not want to try patches per daughter.She did not notice a benefit from memitine. Daughter states pt will repeat herself over and over. She only bathes once a week. Daughter is concerned about pt falling.   09/07/20-We reviewed 09/07/20 MRI brain images Impression 1. Caudate nuclei, putamen and substantia nigra are somewhat small, although there is no overall midbrain atrophy. This may reflect the clinical suspicion of the body dimension. (? Is there clinical parkinsonism). 2. Cerebral atrophy without disproportionate hippocampal atrophy. 3. Small old thalamic lacunar changes. 4. Incidental note is made of a small meningioma on the inner table of the right parietal bone.  Hospital visits:  None in previous 6 months  Medications: Outpatient Encounter Medications as of 11/07/2020  Medication Sig   benazepril-hydrochlorthiazide (LOTENSIN HCT) 20-12.5 MG tablet TAKE 1 TABLET TWICE DAILY   buPROPion (WELLBUTRIN XL) 150 MG 24 hr tablet Take 150 mg by mouth in the morning.    cetirizine (ZYRTEC) 10 MG tablet Take 10 mg by mouth daily.   Cholecalciferol (VITAMIN D3) 25 MCG (1000 UT) CAPS Take 1,000 Units by mouth.    famotidine (PEPCID) 20 MG tablet Take 20 mg by mouth daily as needed.    FLUoxetine (PROZAC) 10 MG capsule Take 20 mg by mouth daily.     fluticasone (FLONASE) 50 MCG/ACT nasal spray Place 2 sprays into both nostrils daily.   Krill Oil 1000 MG CAPS Take 1 capsule by mouth daily.   Menaquinone-7 (VITAMIN K2 PO) Take 1 tablet by mouth daily.   Multiple Vitamin (MULTIVITAMIN) capsule Take 1 capsule by mouth daily.   nitrofurantoin, macrocrystal-monohydrate, (MACROBID) 100 MG capsule Take 1 capsule (100 mg total) by mouth 2 (two) times daily.   OLANZapine (ZYPREXA) 5 MG tablet Take 5 mg by mouth at bedtime.    potassium chloride (MICRO-K) 10 MEQ CR capsule TAKE 1 CAPSULE EVERY DAY   promethazine-dextromethorphan (PROMETHAZINE-DM) 6.25-15 MG/5ML syrup Take 5 mLs by mouth 4 (four) times daily as needed for cough.   rivastigmine (EXELON) 4.6 mg/24hr 4.6 mg daily.   SYNTHROID 50 MCG tablet TAKE 1 TABLET DAILY BEFORE BREAKFAST.   No facility-administered encounter medications on file as of 11/07/2020.    Recent Office Vitals: BP Readings from Last 3 Encounters:  09/30/20 102/82  08/06/20 104/62  06/23/20 110/60   Pulse Readings from Last 3 Encounters:  09/30/20 69  08/06/20 72  06/23/20 93    Wt Readings from Last 3 Encounters:  09/30/20 149 lb (67.6 kg)  08/06/20 147 lb (66.7 kg)  05/05/20 145 lb 3.2 oz (65.9 kg)     Kidney Function Lab Results  Component Value Date/Time   CREATININE 1.18 (H) 08/06/2020 11:10 AM   CREATININE 1.39 (H) 06/23/2020 03:05 PM   GFR 89.64 07/20/2013 11:53 AM   GFRNONAA 36 (L) 06/23/2020 03:05 PM   GFRAA 41 (L) 06/23/2020 03:05 PM  BMP Latest Ref Rng & Units 08/06/2020 06/23/2020 08/14/2019  Glucose 65 - 99 mg/dL 149(H) 146(H) 102(H)  BUN 8 - 27 mg/dL 27 26 22   Creatinine 0.57 - 1.00 mg/dL 1.18(H) 1.39(H) 1.06(H)  BUN/Creat Ratio 12 - 28 23 19 21   Sodium 134 - 144 mmol/L 143 132(L) 137  Potassium 3.5 - 5.2 mmol/L 4.5 3.8 4.5  Chloride 96 - 106 mmol/L 104 94(L) 100  CO2 20 - 29 mmol/L 21 23 24   Calcium 8.7 - 10.3 mg/dL 10.3 8.9 10.1   Unable to reach patient  Current antihypertensive  regimen:  Taking benazepril-hctz 20/12.5 mg one twice a day.  Adherence Review: Is the patient currently on ACE/ARB medication? Yes Does the patient have >5 day gap between last estimated fill dates? CPP to review  Care Gaps: Last annual wellness visit? Was cancelled for 07/17/19  Star Rating Drugs:  Medication:  Last Fill: [facility] Supply Benazepril-HCTZ 10/25/20 [place], [place] Pharmacist Assistant [phone]

## 2020-11-08 ENCOUNTER — Other Ambulatory Visit: Payer: Self-pay | Admitting: Nurse Practitioner

## 2020-11-08 DIAGNOSIS — I10 Essential (primary) hypertension: Secondary | ICD-10-CM

## 2020-11-18 ENCOUNTER — Ambulatory Visit: Payer: Medicare HMO | Admitting: Family Medicine

## 2020-11-25 ENCOUNTER — Encounter: Payer: Self-pay | Admitting: Family Medicine

## 2020-11-25 ENCOUNTER — Ambulatory Visit (INDEPENDENT_AMBULATORY_CARE_PROVIDER_SITE_OTHER): Payer: Medicare HMO | Admitting: Family Medicine

## 2020-11-25 ENCOUNTER — Other Ambulatory Visit: Payer: Self-pay

## 2020-11-25 VITALS — BP 116/68 | HR 72 | Temp 97.4°F | Resp 18 | Ht 61.0 in | Wt 150.0 lb

## 2020-11-25 DIAGNOSIS — E039 Hypothyroidism, unspecified: Secondary | ICD-10-CM

## 2020-11-25 DIAGNOSIS — F0281 Dementia in other diseases classified elsewhere with behavioral disturbance: Secondary | ICD-10-CM

## 2020-11-25 DIAGNOSIS — E782 Mixed hyperlipidemia: Secondary | ICD-10-CM | POA: Diagnosis not present

## 2020-11-25 DIAGNOSIS — I10 Essential (primary) hypertension: Secondary | ICD-10-CM | POA: Diagnosis not present

## 2020-11-25 DIAGNOSIS — R7309 Other abnormal glucose: Secondary | ICD-10-CM | POA: Diagnosis not present

## 2020-11-25 DIAGNOSIS — F02818 Dementia in other diseases classified elsewhere, unspecified severity, with other behavioral disturbance: Secondary | ICD-10-CM

## 2020-11-25 DIAGNOSIS — G3183 Dementia with Lewy bodies: Secondary | ICD-10-CM | POA: Diagnosis not present

## 2020-11-25 MED ORDER — BENAZEPRIL-HYDROCHLOROTHIAZIDE 20-12.5 MG PO TABS: 1.0000 | ORAL_TABLET | Freq: Two times a day (BID) | ORAL | 1 refills | Status: DC

## 2020-11-25 NOTE — Progress Notes (Signed)
Subjective:  Patient ID: Michele Parker, female    DOB: 10-16-1940  Age: 80 y.o. MRN: [id number]  Chief Complaint  Patient presents with   Hyperlipidemia   Hypothyroidism    HPI Lewy body dementia with behavioral disturbance: Patient continues to have confusion.  Severe major depression: Sees psychiatry. On wellbutrin xl, prozac, and zyprexa. Hypertension, unspecified type: Patient is currently on benazepril-hydrochlorthiazide  Acquired hypothyroidism: on synthroid 50 mcg once daily in am.   Current Outpatient Medications on File Prior to Visit  Medication Sig Dispense Refill   buPROPion (WELLBUTRIN XL) 150 MG 24 hr tablet Take 150 mg by mouth in the morning.      cetirizine (ZYRTEC) 10 MG tablet Take 10 mg by mouth daily.     Cholecalciferol (VITAMIN D3) 25 MCG (1000 UT) CAPS Take 1,000 Units by mouth.      famotidine (PEPCID) 20 MG tablet Take 20 mg by mouth daily as needed.      FLUoxetine (PROZAC) 10 MG capsule Take 20 mg by mouth daily.      fluticasone (FLONASE) 50 MCG/ACT nasal spray Place 2 sprays into both nostrils daily. 16 g 6   Krill Oil 1000 MG CAPS Take 1 capsule by mouth daily.     Menaquinone-7 (VITAMIN K2 PO) Take 1 tablet by mouth daily.     Multiple Vitamin (MULTIVITAMIN) capsule Take 1 capsule by mouth daily.     OLANZapine (ZYPREXA) 5 MG tablet Take 5 mg by mouth at bedtime.      potassium chloride (MICRO-K) 10 MEQ CR capsule TAKE 1 CAPSULE EVERY DAY 90 capsule 1   SYNTHROID 50 MCG tablet TAKE 1 TABLET DAILY BEFORE BREAKFAST. 90 tablet 1   No current facility-administered medications on file prior to visit.   Past Medical History:  Diagnosis Date   Angioedema    Bradycardia    Cervicalgia    Depression    Essential hypertension    Hemochromatosis    Hemorrhoid    Hereditary hemochromatosis ([place])    HTN (hypertension)    Hypercalcemia    Hyperlipidemia    Hypothyroid    Memory loss    Osteopenia    Osteopenia    Other amnesia    Other  constipation    Repeated falls    Tremor    Vitamin D deficiency    Past Surgical History:  Procedure Laterality Date   CHOLECYSTECTOMY     KNEE SURGERY     MINOR HEMORRHOIDECTOMY     SHOULDER SURGERY     TUBAL LIGATION      Family History  Problem Relation Age of Onset   Heart attack Mother    Heart disease Mother    CAD Brother    Alcohol abuse Brother    Other Father        unsure of medical hx - died in MVA when patient was 80 years old   Social History   Socioeconomic History   Marital status: Married    Spouse name: Not on file   Number of children: 2   Years of education: 12   Highest education level: High school graduate  Occupational History   Occupation: Retired-hosiery  Tobacco Use   Smoking status: Never   Smokeless tobacco: Never  Vaping Use   Vaping Use: Never used  Substance and Sexual Activity   Alcohol use: No   Drug use: No   Sexual activity: Not on file  Other Topics Concern   Not on file  Social History Narrative   Lives at home with her husband.   Left-handed.   2 cups caffeine per day.   Social Determinants of Health   Financial Resource Strain: Not on file  Food Insecurity: No Food Insecurity   Worried About Charity fundraiser in the Last Year: Never true   Ran Out of Food in the Last Year: Never true  Transportation Needs: No Transportation Needs   Lack of Transportation (Medical): No   Lack of Transportation (Non-Medical): No  Physical Activity: Not on file  Stress: Not on file  Social Connections: Not on file    Review of Systems  Constitutional:  Negative for chills, fatigue and fever.  HENT:  Negative for congestion, rhinorrhea and sore throat.   Respiratory:  Positive for cough. Negative for shortness of breath.   Cardiovascular:  Negative for chest pain.  Gastrointestinal:  Negative for abdominal pain, constipation, diarrhea, nausea and vomiting.  Genitourinary:  Negative for dysuria and urgency.       Poor bladder  control.  Musculoskeletal:  Positive for back pain. Negative for myalgias.  Neurological:  Negative for dizziness, weakness, light-headedness and headaches.  Psychiatric/Behavioral:  Negative for dysphoric mood. The patient is not nervous/anxious.     Objective:  BP 116/68   Pulse 72   Temp (!) 97.4 F (36.3 C)   Resp 18   Ht 5\' 1"  (1.549 m)   Wt 150 lb (68 kg)   BMI 28.34 kg/m   BP/Weight 11/25/2020 12/31/5051 02/04/6733  Systolic BP 193 790 240  Diastolic BP 68 82 62  Wt. (Lbs) 150 149 147  BMI 28.34 28.15 27.78    Physical Exam Vitals reviewed.  Constitutional:      Appearance: Normal appearance. She is normal weight.  Cardiovascular:     Rate and Rhythm: Normal rate and regular rhythm.     Heart sounds: Normal heart sounds.  Pulmonary:     Effort: Pulmonary effort is normal. No respiratory distress.     Breath sounds: Normal breath sounds.  Abdominal:     General: Abdomen is flat. Bowel sounds are normal.     Palpations: Abdomen is soft.     Tenderness: There is no abdominal tenderness.  Neurological:     Mental Status: She is alert.  Psychiatric:        Mood and Affect: Mood normal.        Behavior: Behavior normal.    Diabetic Foot Exam - Simple   No data filed      Lab Results  Component Value Date   WBC 7.1 11/25/2020   HGB 14.1 11/25/2020   HCT 44.0 11/25/2020   PLT 238 11/25/2020   GLUCOSE 112 (H) 11/25/2020   CHOL 245 (H) 11/25/2020   TRIG 242 (H) 11/25/2020   HDL 60 11/25/2020   LDLCALC 142 (H) 11/25/2020   ALT 24 11/25/2020   AST 24 11/25/2020   NA 140 11/25/2020   K 4.8 11/25/2020   CL 100 11/25/2020   CREATININE 1.47 (H) 11/25/2020   BUN 34 (H) 11/25/2020   CO2 23 11/25/2020   TSH 3.330 11/25/2020   HGBA1C 6.0 (H) 11/25/2020      Assessment & Plan:   1. Hypertension Well controlled.  No changes to medicines.  Continue to work on eating a healthy diet and exercise.  Labs drawn today.  - benazepril-hydrochlorthiazide (LOTENSIN  HCT) 20-12.5 MG tablet; Take 1 tablet by mouth 2 (two) times daily.  Dispense: 180 tablet; Refill:  1 - cbc - cmp  2. Lewy body dementia with behavioral disturbance (HCC) Continue current medications.  3. Acquired hypothyroidism The current medical regimen is effective;  continue present plan and medications. - TSH  4. Mixed hyperlipidemia Uncontrolled.  I do not recommend cholesterol medicine due to her advanced dementia. - Lipid panel  6. Elevated glucose level - Hemoglobin A1c   Meds ordered this encounter  Medications   benazepril-hydrochlorthiazide (LOTENSIN HCT) 20-12.5 MG tablet    Sig: Take 1 tablet by mouth 2 (two) times daily.    Dispense:  180 tablet    Refill:  1    Orders Placed This Encounter  Procedures   CBC with Differential/Platelet   Comprehensive metabolic panel   Hemoglobin A1c   Lipid panel   TSH   Cardiovascular Risk Assessment     Follow-up: Return in about 3 months (around 02/25/2021).  An After Visit Summary was printed and given to the patient.  [name], MD [facility] [phone]

## 2020-11-25 NOTE — Patient Instructions (Signed)
[facility]) [address] [place], [place] [address]  Monday through Friday, 8 a.m. to 5 p.m. [phone]. For TTY, please call [phone].

## 2020-11-26 LAB — CBC WITH DIFFERENTIAL/PLATELET
Basophils Absolute: 0 10*3/uL (ref 0.0–0.2)
Basos: 1 %
EOS (ABSOLUTE): 0.2 10*3/uL (ref 0.0–0.4)
Eos: 3 %
Hematocrit: 44 % (ref 34.0–46.6)
Hemoglobin: 14.1 g/dL (ref 11.1–15.9)
Immature Grans (Abs): 0 10*3/uL (ref 0.0–0.1)
Immature Granulocytes: 1 %
Lymphocytes Absolute: 1.5 10*3/uL (ref 0.7–3.1)
Lymphs: 22 %
MCH: 29.3 pg (ref 26.6–33.0)
MCHC: 32 g/dL (ref 31.5–35.7)
MCV: 91 fL (ref 79–97)
Monocytes Absolute: 0.8 10*3/uL (ref 0.1–0.9)
Monocytes: 11 %
Neutrophils Absolute: 4.5 10*3/uL (ref 1.4–7.0)
Neutrophils: 62 %
Platelets: 238 10*3/uL (ref 150–450)
RBC: 4.82 x10E6/uL (ref 3.77–5.28)
RDW: 12.3 % (ref 11.7–15.4)
WBC: 7.1 10*3/uL (ref 3.4–10.8)

## 2020-11-26 LAB — LIPID PANEL
Chol/HDL Ratio: 4.1 ratio (ref 0.0–4.4)
Cholesterol, Total: 245 mg/dL — ABNORMAL HIGH (ref 100–199)
HDL: 60 mg/dL (ref >39)
LDL Chol Calc (NIH): 142 mg/dL — ABNORMAL HIGH (ref 0–99)
Triglycerides: 242 mg/dL — ABNORMAL HIGH (ref 0–149)
VLDL Cholesterol Cal: 43 mg/dL — ABNORMAL HIGH (ref 5–40)

## 2020-11-26 LAB — COMPREHENSIVE METABOLIC PANEL
ALT: 24 IU/L (ref 0–32)
AST: 24 IU/L (ref 0–40)
Albumin/Globulin Ratio: 2.6 — ABNORMAL HIGH (ref 1.2–2.2)
Albumin: 4.6 g/dL (ref 3.6–4.6)
Alkaline Phosphatase: 90 IU/L (ref 44–121)
BUN/Creatinine Ratio: 23 (ref 12–28)
BUN: 34 mg/dL — ABNORMAL HIGH (ref 8–27)
Bilirubin Total: 0.8 mg/dL (ref 0.0–1.2)
CO2: 23 mmol/L (ref 20–29)
Calcium: 10.2 mg/dL (ref 8.7–10.3)
Chloride: 100 mmol/L (ref 96–106)
Creatinine, Ser: 1.47 mg/dL — ABNORMAL HIGH (ref 0.57–1.00)
Globulin, Total: 1.8 g/dL (ref 1.5–4.5)
Glucose: 112 mg/dL — ABNORMAL HIGH (ref 65–99)
Potassium: 4.8 mmol/L (ref 3.5–5.2)
Sodium: 140 mmol/L (ref 134–144)
Total Protein: 6.4 g/dL (ref 6.0–8.5)
eGFR: 35 mL/min/{1.73_m2} — ABNORMAL LOW (ref >59)

## 2020-11-26 LAB — HEMOGLOBIN A1C
Est. average glucose Bld gHb Est-mCnc: 126 mg/dL
Hgb A1c MFr Bld: 6 % — ABNORMAL HIGH (ref 4.8–5.6)

## 2020-11-26 LAB — TSH: TSH: 3.33 u[IU]/mL (ref 0.450–4.500)

## 2020-11-26 LAB — CARDIOVASCULAR RISK ASSESSMENT

## 2020-11-26 NOTE — Telephone Encounter (Signed)
error 

## 2020-12-02 ENCOUNTER — Other Ambulatory Visit: Payer: Self-pay

## 2020-12-02 DIAGNOSIS — N289 Disorder of kidney and ureter, unspecified: Secondary | ICD-10-CM

## 2020-12-03 DIAGNOSIS — F3341 Major depressive disorder, recurrent, in partial remission: Secondary | ICD-10-CM | POA: Diagnosis not present

## 2020-12-03 DIAGNOSIS — F29 Unspecified psychosis not due to a substance or known physiological condition: Secondary | ICD-10-CM | POA: Diagnosis not present

## 2020-12-03 DIAGNOSIS — Z9889 Other specified postprocedural states: Secondary | ICD-10-CM | POA: Diagnosis not present

## 2020-12-03 DIAGNOSIS — F0390 Unspecified dementia without behavioral disturbance: Secondary | ICD-10-CM | POA: Diagnosis not present

## 2020-12-09 ENCOUNTER — Other Ambulatory Visit: Payer: Self-pay

## 2020-12-09 DIAGNOSIS — N289 Disorder of kidney and ureter, unspecified: Secondary | ICD-10-CM

## 2020-12-11 ENCOUNTER — Telehealth: Payer: Self-pay

## 2020-12-11 NOTE — Progress Notes (Signed)
    Chronic Care Management Pharmacy Assistant   Name: Michele Parker  MRN: [id number] DOB: 01/06/1941   Reason for Encounter: Disease State for general adherence   Recent office visits:  12/09/20-PCP placed lab orders,   11/25/20-Dr [name] PCP, dementia with behavioral disturbance, Labs, results not available, no medication changes. No follow up listed.   Recent consult visits:  12/03/20-Dr Rothschild, advised to Hold off Wellbutrin and observe mood for the next few weeks. Continue Prozac 20 mg after breakfast.    Hospital visits:  None in previous 6 months  Medications: Outpatient Encounter Medications as of 12/11/2020  Medication Sig   benazepril-hydrochlorthiazide (LOTENSIN HCT) 20-12.5 MG tablet Take 1 tablet by mouth 2 (two) times daily.   buPROPion (WELLBUTRIN XL) 150 MG 24 hr tablet Take 150 mg by mouth in the morning.    cetirizine (ZYRTEC) 10 MG tablet Take 10 mg by mouth daily.   Cholecalciferol (VITAMIN D3) 25 MCG (1000 UT) CAPS Take 1,000 Units by mouth.    famotidine (PEPCID) 20 MG tablet Take 20 mg by mouth daily as needed.    FLUoxetine (PROZAC) 10 MG capsule Take 20 mg by mouth daily.    fluticasone (FLONASE) 50 MCG/ACT nasal spray Place 2 sprays into both nostrils daily.   Krill Oil 1000 MG CAPS Take 1 capsule by mouth daily.   Menaquinone-7 (VITAMIN K2 PO) Take 1 tablet by mouth daily.   Multiple Vitamin (MULTIVITAMIN) capsule Take 1 capsule by mouth daily.   OLANZapine (ZYPREXA) 5 MG tablet Take 5 mg by mouth at bedtime.    potassium chloride (MICRO-K) 10 MEQ CR capsule TAKE 1 CAPSULE EVERY DAY   SYNTHROID 50 MCG tablet TAKE 1 TABLET DAILY BEFORE BREAKFAST.   No facility-administered encounter medications on file as of 12/11/2020.    Spoke with patients daughter, stated the patient is doing well.  Daughter stated she has no current medication issues.  She does on occasion take the patients blood pressure, she did not have any  specific numbers, but stated they are always good.  Patient is not active much.  Daughter stated she does try and have the patient eat healthy.    Michele Parker, [place] Pharmacist Assistant [phone]   Care Gaps: Last annual wellness visit? Was cancelled for 07/17/19  Star Rating Drugs: Benazepril-HCTZ       10/25/20            Michele Parker, [place] Pharmacist Assistant [phone]

## 2021-01-01 ENCOUNTER — Other Ambulatory Visit: Payer: Medicare HMO

## 2021-01-05 ENCOUNTER — Other Ambulatory Visit: Payer: Self-pay

## 2021-01-05 ENCOUNTER — Other Ambulatory Visit: Payer: Medicare HMO

## 2021-01-05 DIAGNOSIS — N289 Disorder of kidney and ureter, unspecified: Secondary | ICD-10-CM | POA: Diagnosis not present

## 2021-01-06 LAB — COMPREHENSIVE METABOLIC PANEL
ALT: 21 IU/L (ref 0–32)
AST: 23 IU/L (ref 0–40)
Albumin/Globulin Ratio: 2.3 — ABNORMAL HIGH (ref 1.2–2.2)
Albumin: 4.4 g/dL (ref 3.6–4.6)
Alkaline Phosphatase: 81 IU/L (ref 44–121)
BUN/Creatinine Ratio: 21 (ref 12–28)
BUN: 22 mg/dL (ref 8–27)
Bilirubin Total: 0.7 mg/dL (ref 0.0–1.2)
CO2: 21 mmol/L (ref 20–29)
Calcium: 10.3 mg/dL (ref 8.7–10.3)
Chloride: 103 mmol/L (ref 96–106)
Creatinine, Ser: 1.04 mg/dL — ABNORMAL HIGH (ref 0.57–1.00)
Globulin, Total: 1.9 g/dL (ref 1.5–4.5)
Glucose: 166 mg/dL — ABNORMAL HIGH (ref 65–99)
Potassium: 4.3 mmol/L (ref 3.5–5.2)
Sodium: 139 mmol/L (ref 134–144)
Total Protein: 6.3 g/dL (ref 6.0–8.5)
eGFR: 54 mL/min/{1.73_m2} — ABNORMAL LOW (ref >59)

## 2021-01-10 ENCOUNTER — Other Ambulatory Visit: Payer: Self-pay | Admitting: Family Medicine

## 2021-02-19 ENCOUNTER — Telehealth: Payer: Self-pay

## 2021-02-19 NOTE — Chronic Care Management (AMB) (Signed)
    Chronic Care Management Pharmacy Assistant   Name: Michele Parker  MRN: [id number] DOB: 03/03/1941  Reason for Encounter: General Adherence Call    Recent office visits:  None since 12/11/20  Recent consult visits:  None since 12/11/20  Hospital visits:  None in previous 6 months  Medications: Outpatient Encounter Medications as of 02/19/2021  Medication Sig   benazepril-hydrochlorthiazide (LOTENSIN HCT) 20-12.5 MG tablet Take 1 tablet by mouth 2 (two) times daily.   buPROPion (WELLBUTRIN XL) 150 MG 24 hr tablet Take 150 mg by mouth in the morning.    cetirizine (ZYRTEC) 10 MG tablet Take 10 mg by mouth daily.   Cholecalciferol (VITAMIN D3) 25 MCG (1000 UT) CAPS Take 1,000 Units by mouth.    famotidine (PEPCID) 20 MG tablet Take 20 mg by mouth daily as needed.    FLUoxetine (PROZAC) 10 MG capsule Take 20 mg by mouth daily.    fluticasone (FLONASE) 50 MCG/ACT nasal spray Place 2 sprays into both nostrils daily.   Krill Oil 1000 MG CAPS Take 1 capsule by mouth daily.   Menaquinone-7 (VITAMIN K2 PO) Take 1 tablet by mouth daily.   Multiple Vitamin (MULTIVITAMIN) capsule Take 1 capsule by mouth daily.   OLANZapine (ZYPREXA) 5 MG tablet Take 5 mg by mouth at bedtime.    potassium chloride (MICRO-K) 10 MEQ CR capsule TAKE 1 CAPSULE EVERY DAY   SYNTHROID 50 MCG tablet TAKE 1 TABLET DAILY BEFORE BREAKFAST   No facility-administered encounter medications on file as of 02/19/2021.    Contacted [name] for general disease state    Patient Questions:  Have you had any problems recently with your health?  I spoke with daughter and she stated that pt dementia is getting worse. They see a Neurologist but they stated there is not a lot they can do. Daughter stated she is sleeping anywhere between 16 to 18 hours a day. She cannot get her out of bed most days. She stated the neurologist stated it will continue getting worse and just to prepare.   Have you had any problems with your  pharmacy? Daughter stated no issues  What issues or side effects are you having with your medications?Daughter stated they have taking her off the Bupropion due to pt developed tremors on this medication. Since being off the pt tremors have gotten better.  What would you like me to pass along to [name], CPP for him to help you with?  Pt daughter just states at this time in mother's life that they are just doing the best they can.  What can we do to take care of you better? Pt daughter has nothing to add here    [facility] Medications: Medication Name Last Fill Days Supply Benazepril-HCTZ 12/29/20  90ds   Care Gaps: Last annual wellness visit: Last done was on 6/81/27 If applicable: N/A Last eye exam / retinopathy screening? Diabetic foot exam?      [place]

## 2021-03-04 ENCOUNTER — Ambulatory Visit: Payer: Medicare HMO | Admitting: Family Medicine

## 2021-03-24 ENCOUNTER — Ambulatory Visit (INDEPENDENT_AMBULATORY_CARE_PROVIDER_SITE_OTHER): Payer: Medicare HMO | Admitting: Family Medicine

## 2021-03-24 DIAGNOSIS — E039 Hypothyroidism, unspecified: Secondary | ICD-10-CM

## 2021-03-24 DIAGNOSIS — F02818 Dementia in other diseases classified elsewhere, unspecified severity, with other behavioral disturbance: Secondary | ICD-10-CM

## 2021-03-24 DIAGNOSIS — R7309 Other abnormal glucose: Secondary | ICD-10-CM

## 2021-03-24 DIAGNOSIS — G3183 Dementia with Lewy bodies: Secondary | ICD-10-CM

## 2021-03-24 DIAGNOSIS — I10 Essential (primary) hypertension: Secondary | ICD-10-CM

## 2021-03-24 DIAGNOSIS — E782 Mixed hyperlipidemia: Secondary | ICD-10-CM

## 2021-03-24 NOTE — Progress Notes (Deleted)
No showed

## 2021-04-28 ENCOUNTER — Ambulatory Visit (INDEPENDENT_AMBULATORY_CARE_PROVIDER_SITE_OTHER): Payer: Medicare HMO | Admitting: Family Medicine

## 2021-04-28 ENCOUNTER — Other Ambulatory Visit: Payer: Self-pay

## 2021-04-28 VITALS — BP 136/60 | HR 72 | Temp 97.2°F | Resp 18 | Wt 153.0 lb

## 2021-04-28 DIAGNOSIS — E039 Hypothyroidism, unspecified: Secondary | ICD-10-CM | POA: Diagnosis not present

## 2021-04-28 DIAGNOSIS — I10 Essential (primary) hypertension: Secondary | ICD-10-CM

## 2021-04-28 DIAGNOSIS — F02818 Dementia in other diseases classified elsewhere, unspecified severity, with other behavioral disturbance: Secondary | ICD-10-CM

## 2021-04-28 DIAGNOSIS — K219 Gastro-esophageal reflux disease without esophagitis: Secondary | ICD-10-CM

## 2021-04-28 DIAGNOSIS — F331 Major depressive disorder, recurrent, moderate: Secondary | ICD-10-CM

## 2021-04-28 DIAGNOSIS — Z23 Encounter for immunization: Secondary | ICD-10-CM

## 2021-04-28 DIAGNOSIS — Z111 Encounter for screening for respiratory tuberculosis: Secondary | ICD-10-CM

## 2021-04-28 DIAGNOSIS — G3183 Dementia with Lewy bodies: Secondary | ICD-10-CM | POA: Diagnosis not present

## 2021-04-28 NOTE — Progress Notes (Signed)
Subjective:  Patient ID: Michele Parker, female    DOB: 1941/01/26  Age: 80 y.o. MRN: [id number]  Chief Complaint  Patient presents with   Hypertension    HPI Hypertension: On benazepril-hctz 20/12.5 mg one twice a day. Insomnia:Trazodone 50 mg 1 1/2 tablets daily at ngiht.  Depression: Prozac 10 mg once daily, Stopped olanzopine.  Gerd; Pepcid 20 mg daily. Still coughing. Has helped gerd. On zyrtec also.  Coughing at night a lot.  Hypothyroidism: on synthroid 50 mcg once daily.  Lewy Body dementia: Severe. Worsening. Lives with her daughter. Pt is bowel and bladder incontinence. Hides dirty underwear/depends.Pt has visual hallucinations. Pt's daughter has been looking at assisted living and is ready to place her at [facility]. Requesting FL2 be filled out and needs test for tuberculosis.   Current Outpatient Medications on File Prior to Visit  Medication Sig Dispense Refill   traZODone (DESYREL) 50 MG tablet Take by mouth. 1 1/2 to 2 tablets at bedtime     benazepril-hydrochlorthiazide (LOTENSIN HCT) 20-12.5 MG tablet Take 1 tablet by mouth 2 (two) times daily. 180 tablet 1   cetirizine (ZYRTEC) 10 MG tablet Take 10 mg by mouth daily.     Cholecalciferol (VITAMIN D3) 25 MCG (1000 UT) CAPS Take 1,000 Units by mouth.      famotidine (PEPCID) 20 MG tablet Take 20 mg by mouth daily as needed.      FLUoxetine (PROZAC) 10 MG capsule Take 20 mg by mouth daily.      fluticasone (FLONASE) 50 MCG/ACT nasal spray Place 2 sprays into both nostrils daily. 16 g 6   Krill Oil 1000 MG CAPS Take 1 capsule by mouth daily.     Multiple Vitamin (MULTIVITAMIN) capsule Take 1 capsule by mouth daily.     potassium chloride (MICRO-K) 10 MEQ CR capsule TAKE 1 CAPSULE EVERY DAY 90 capsule 1   SYNTHROID 50 MCG tablet TAKE 1 TABLET DAILY BEFORE BREAKFAST 90 tablet 3   No current facility-administered medications on file prior to visit.   Past Medical History:  Diagnosis Date   Angioedema    Bradycardia     Cervicalgia    Depression    Essential hypertension    Hemochromatosis    Hemorrhoid    Hereditary hemochromatosis ([place])    HTN (hypertension)    Hypercalcemia    Hyperlipidemia    Hypothyroid    Memory loss    Osteopenia    Osteopenia    Other amnesia    Other constipation    Repeated falls    Tremor    Vitamin D deficiency    Past Surgical History:  Procedure Laterality Date   CHOLECYSTECTOMY     KNEE SURGERY     MINOR HEMORRHOIDECTOMY     SHOULDER SURGERY     TUBAL LIGATION      Family History  Problem Relation Age of Onset   Heart attack Mother    Heart disease Mother    CAD Brother    Alcohol abuse Brother    Other Father        unsure of medical hx - died in MVA when patient was 80 years old   Social History   Socioeconomic History   Marital status: Married    Spouse name: Not on file   Number of children: 2   Years of education: 12   Highest education level: High school graduate  Occupational History   Occupation: Retired-hosiery  Tobacco Use   Smoking status:  Never   Smokeless tobacco: Never  Vaping Use   Vaping Use: Never used  Substance and Sexual Activity   Alcohol use: No   Drug use: No   Sexual activity: Not on file  Other Topics Concern   Not on file  Social History Narrative   Lives at home with her husband.   Left-handed.   2 cups caffeine per day.   Social Determinants of Health   Financial Resource Strain: Low Risk    Difficulty of Paying Living Expenses: Not hard at all  Food Insecurity: Not on file  Transportation Needs: Not on file  Physical Activity: Not on file  Stress: Not on file  Social Connections: Not on file    Review of Systems  Constitutional:  Positive for fatigue. Negative for chills and fever.  HENT:  Negative for congestion, rhinorrhea and sore throat.   Respiratory:  Negative for cough and shortness of breath.   Cardiovascular:  Negative for chest pain.  Gastrointestinal:  Negative for abdominal pain,  constipation, diarrhea, nausea and vomiting.  Genitourinary:  Negative for dysuria and urgency.       Poor bladder control  Musculoskeletal:  Positive for arthralgias, back pain and myalgias.  Neurological:  Positive for tremors and weakness. Negative for dizziness, light-headedness and headaches.  Psychiatric/Behavioral:  Negative for dysphoric mood. The patient is not nervous/anxious.     Objective:  BP 136/60   Pulse 72   Temp (!) 97.2 F (36.2 C)   Resp 18   Wt 153 lb (69.4 kg)   BMI 28.91 kg/m   BP/Weight 04/28/2021 01/29/5175 06/06/735  Systolic BP 106 269 485  Diastolic BP 60 68 82  Wt. (Lbs) 153 150 149  BMI 28.91 28.34 28.15    Physical Exam Vitals reviewed.  Constitutional:      Appearance: Normal appearance. She is normal weight.  Cardiovascular:     Rate and Rhythm: Normal rate and regular rhythm.     Heart sounds: Normal heart sounds.  Pulmonary:     Effort: Pulmonary effort is normal. No respiratory distress.     Breath sounds: Normal breath sounds.  Abdominal:     General: Abdomen is flat. Bowel sounds are normal.     Palpations: Abdomen is soft.     Tenderness: There is no abdominal tenderness.  Neurological:     Mental Status: She is alert.  Psychiatric:        Mood and Affect: Mood normal.        Behavior: Behavior normal.    Diabetic Foot Exam - Simple   No data filed      Lab Results  Component Value Date   WBC 7.3 04/28/2021   HGB 14.3 04/28/2021   HCT 42.4 04/28/2021   PLT 183 04/28/2021   GLUCOSE 116 (H) 04/28/2021   CHOL 245 (H) 11/25/2020   TRIG 242 (H) 11/25/2020   HDL 60 11/25/2020   LDLCALC 142 (H) 11/25/2020   ALT 20 04/28/2021   AST 24 04/28/2021   NA 137 04/28/2021   K 4.3 04/28/2021   CL 97 04/28/2021   CREATININE 1.12 (H) 04/28/2021   BUN 20 04/28/2021   CO2 22 04/28/2021   TSH 3.950 04/28/2021   HGBA1C 6.0 (H) 11/25/2020      Assessment & Plan:   Problem List Items Addressed This Visit        Cardiovascular and Mediastinum   Essential hypertension    The current medical regimen is effective;  continue present  plan and medications.       Relevant Orders   CBC with Differential/Platelet (Completed)   Comprehensive metabolic panel (Completed)   TSH (Completed)     Digestive   GERD (gastroesophageal reflux disease)    Continue pepcid.         Endocrine   Hypothyroidism    The current medical regimen is effective;  continue present plan and medications.         Nervous and Auditory   Lewy body dementia with behavioral disturbance ([place]) - Primary    Requires greater level of care.  FL2 filled out.        Other   Depression, major, recurrent, moderate (HCC)    Continue prozac. Mgmt by psychiatry.      Relevant Medications   traZODone (DESYREL) 50 MG tablet   Other Visit Diagnoses     Tuberculosis screening       Relevant Orders   QuantiFERON-TB Gold Plus (Completed)   Need for influenza vaccination       Relevant Orders   Flu Vaccine QUAD High Dose(Fluad) (Completed)     .  No orders of the defined types were placed in this encounter.   Orders Placed This Encounter  Procedures   Flu Vaccine QUAD High Dose(Fluad)   CBC with Differential/Platelet   Comprehensive metabolic panel   TSH   QuantiFERON-TB Gold Plus     Follow-up: No follow-ups on file.  An After Visit Summary was printed and given to the patient.  [name], MD [facility] [phone]

## 2021-04-29 LAB — COMPREHENSIVE METABOLIC PANEL
ALT: 20 IU/L (ref 0–32)
AST: 24 IU/L (ref 0–40)
Albumin/Globulin Ratio: 2.6 — ABNORMAL HIGH (ref 1.2–2.2)
Albumin: 4.6 g/dL (ref 3.6–4.6)
Alkaline Phosphatase: 78 IU/L (ref 44–121)
BUN/Creatinine Ratio: 18 (ref 12–28)
BUN: 20 mg/dL (ref 8–27)
Bilirubin Total: 0.8 mg/dL (ref 0.0–1.2)
CO2: 22 mmol/L (ref 20–29)
Calcium: 10.2 mg/dL (ref 8.7–10.3)
Chloride: 97 mmol/L (ref 96–106)
Creatinine, Ser: 1.12 mg/dL — ABNORMAL HIGH (ref 0.57–1.00)
Globulin, Total: 1.8 g/dL (ref 1.5–4.5)
Glucose: 116 mg/dL — ABNORMAL HIGH (ref 70–99)
Potassium: 4.3 mmol/L (ref 3.5–5.2)
Sodium: 137 mmol/L (ref 134–144)
Total Protein: 6.4 g/dL (ref 6.0–8.5)
eGFR: 49 mL/min/{1.73_m2} — ABNORMAL LOW (ref >59)

## 2021-04-29 LAB — CBC WITH DIFFERENTIAL/PLATELET
Basophils Absolute: 0.1 10*3/uL (ref 0.0–0.2)
Basos: 1 %
EOS (ABSOLUTE): 0.2 10*3/uL (ref 0.0–0.4)
Eos: 3 %
Hematocrit: 42.4 % (ref 34.0–46.6)
Hemoglobin: 14.3 g/dL (ref 11.1–15.9)
Immature Grans (Abs): 0 10*3/uL (ref 0.0–0.1)
Immature Granulocytes: 0 %
Lymphocytes Absolute: 1.6 10*3/uL (ref 0.7–3.1)
Lymphs: 22 %
MCH: 29.6 pg (ref 26.6–33.0)
MCHC: 33.7 g/dL (ref 31.5–35.7)
MCV: 88 fL (ref 79–97)
Monocytes Absolute: 0.9 10*3/uL (ref 0.1–0.9)
Monocytes: 13 %
Neutrophils Absolute: 4.5 10*3/uL (ref 1.4–7.0)
Neutrophils: 61 %
Platelets: 183 10*3/uL (ref 150–450)
RBC: 4.83 x10E6/uL (ref 3.77–5.28)
RDW: 12.3 % (ref 11.7–15.4)
WBC: 7.3 10*3/uL (ref 3.4–10.8)

## 2021-04-29 LAB — TSH: TSH: 3.95 u[IU]/mL (ref 0.450–4.500)

## 2021-04-30 LAB — QUANTIFERON-TB GOLD PLUS
QuantiFERON Mitogen Value: >10 IU/mL
QuantiFERON Nil Value: 0 IU/mL
QuantiFERON TB1 Ag Value: 0 IU/mL
QuantiFERON TB2 Ag Value: 0 IU/mL
QuantiFERON-TB Gold Plus: NEGATIVE

## 2021-05-04 ENCOUNTER — Ambulatory Visit: Payer: Medicare HMO
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
## 2021-05-11 ENCOUNTER — Encounter: Payer: Self-pay | Admitting: Family Medicine

## 2021-05-11 DIAGNOSIS — F02818 Dementia in other diseases classified elsewhere, unspecified severity, with other behavioral disturbance: Secondary | ICD-10-CM | POA: Insufficient documentation

## 2021-05-11 DIAGNOSIS — F331 Major depressive disorder, recurrent, moderate: Secondary | ICD-10-CM

## 2021-05-11 DIAGNOSIS — E039 Hypothyroidism, unspecified: Secondary | ICD-10-CM | POA: Insufficient documentation

## 2021-05-11 DIAGNOSIS — I10 Essential (primary) hypertension: Secondary | ICD-10-CM | POA: Insufficient documentation

## 2021-05-11 DIAGNOSIS — K219 Gastro-esophageal reflux disease without esophagitis: Secondary | ICD-10-CM

## 2021-05-11 HISTORY — DX: Hypothyroidism, unspecified: E03.9

## 2021-05-11 HISTORY — DX: Major depressive disorder, recurrent, moderate: F33.1

## 2021-05-11 HISTORY — DX: Neurocognitive disorder with Lewy bodies: F02.818

## 2021-05-11 HISTORY — DX: Gastro-esophageal reflux disease without esophagitis: K21.9

## 2021-05-11 NOTE — Assessment & Plan Note (Signed)
Continue pepcid

## 2021-05-11 NOTE — Assessment & Plan Note (Signed)
The current medical regimen is effective;  continue present plan and medications.  

## 2021-05-11 NOTE — Assessment & Plan Note (Signed)
Requires greater level of care.  FL2 filled out.

## 2021-05-11 NOTE — Assessment & Plan Note (Signed)
Continue prozac. Mgmt by psychiatry.

## 2021-05-19 ENCOUNTER — Other Ambulatory Visit: Payer: Self-pay

## 2021-05-19 DIAGNOSIS — I10 Essential (primary) hypertension: Secondary | ICD-10-CM

## 2021-05-19 MED ORDER — BENAZEPRIL-HYDROCHLOROTHIAZIDE 20-12.5 MG PO TABS: 1.0000 | ORAL_TABLET | Freq: Two times a day (BID) | ORAL | 1 refills | Status: DC

## 2021-05-19 MED ORDER — BENAZEPRIL-HYDROCHLOROTHIAZIDE 20-12.5 MG PO TABS: 1.0000 | ORAL_TABLET | Freq: Two times a day (BID) | ORAL | 0 refills | Status: DC

## 2021-05-19 MED ORDER — BENAZEPRIL-HYDROCHLOROTHIAZIDE 20-12.5 MG PO TABS
1.0000 | ORAL_TABLET | Freq: Two times a day (BID) | ORAL | 0 refills | Status: DC
Start: 2021-05-19 — End: 2022-06-24

## 2021-06-08 ENCOUNTER — Telehealth: Payer: Self-pay

## 2021-06-08 DIAGNOSIS — F411 Generalized anxiety disorder: Secondary | ICD-10-CM | POA: Diagnosis not present

## 2021-06-08 DIAGNOSIS — Z9889 Other specified postprocedural states: Secondary | ICD-10-CM | POA: Diagnosis not present

## 2021-06-08 DIAGNOSIS — F03B0 Unspecified dementia, moderate, without behavioral disturbance, psychotic disturbance, mood disturbance, and anxiety: Secondary | ICD-10-CM | POA: Diagnosis not present

## 2021-06-08 DIAGNOSIS — F3341 Major depressive disorder, recurrent, in partial remission: Secondary | ICD-10-CM | POA: Diagnosis not present

## 2021-06-08 NOTE — Telephone Encounter (Signed)
Had irregular heart rate at Psy. Appointment today.  They were told to schedule follow-up with his primary care.  Appointment scheduled for tomorrow.

## 2021-06-09 ENCOUNTER — Ambulatory Visit (INDEPENDENT_AMBULATORY_CARE_PROVIDER_SITE_OTHER): Payer: Medicare HMO | Admitting: Family Medicine

## 2021-06-09 VITALS — BP 128/78 | HR 88 | Temp 98.6°F | Resp 16 | Wt 150.0 lb

## 2021-06-09 DIAGNOSIS — I493 Ventricular premature depolarization: Secondary | ICD-10-CM

## 2021-06-09 DIAGNOSIS — R0989 Other specified symptoms and signs involving the circulatory and respiratory systems: Secondary | ICD-10-CM

## 2021-06-09 NOTE — Progress Notes (Signed)
Acute Office Visit  Subjective:    Patient ID: Michele Parker, female    DOB: [birth year], 81 y.o.   MRN: [id number]  Chief Complaint  Patient presents with   Irregular Heart Beat    HPI: Patient is in today for irregular heart beat noted by psychiatry yesterday. Pt is asymptomatic. Denies chest pain, sob, or dizziness.   Past Medical History:  Diagnosis Date   Angioedema    Bradycardia    Cervicalgia    Depression    Essential hypertension    Hemochromatosis    Hemorrhoid    Hereditary hemochromatosis ([place])    HTN (hypertension)    Hypercalcemia    Hyperlipidemia    Hypothyroid    Memory loss    Osteopenia    Osteopenia    Other amnesia    Other constipation    Repeated falls    Tremor    Vitamin D deficiency     Past Surgical History:  Procedure Laterality Date   CHOLECYSTECTOMY     KNEE SURGERY     MINOR HEMORRHOIDECTOMY     SHOULDER SURGERY     TUBAL LIGATION      Family History  Problem Relation Age of Onset   Heart attack Mother    Heart disease Mother    CAD Brother    Alcohol abuse Brother    Other Father        unsure of medical hx - died in MVA when patient was 81 years old    Social History   Socioeconomic History   Marital status: Married    Spouse name: Not on file   Number of children: 2   Years of education: 12   Highest education level: High school graduate  Occupational History   Occupation: Retired-hosiery  Tobacco Use   Smoking status: Never   Smokeless tobacco: Never  Vaping Use   Vaping Use: Never used  Substance and Sexual Activity   Alcohol use: No   Drug use: No   Sexual activity: Not on file  Other Topics Concern   Not on file  Social History Narrative   Lives at home with her husband.   Left-handed.   2 cups caffeine per day.   Social Determinants of Health   Financial Resource Strain: Low Risk    Difficulty of Paying Living Expenses: Not hard at all  Food Insecurity: Not on file  Transportation Needs:  Not on file  Physical Activity: Not on file  Stress: Not on file  Social Connections: Not on file  Intimate Partner Violence: Not on file    Outpatient Medications Prior to Visit  Medication Sig Dispense Refill   benazepril-hydrochlorthiazide (LOTENSIN HCT) 20-12.5 MG tablet Take 1 tablet by mouth 2 (two) times daily. 60 tablet 0   cetirizine (ZYRTEC) 10 MG tablet Take 10 mg by mouth daily.     Cholecalciferol (VITAMIN D3) 25 MCG (1000 UT) CAPS Take 1,000 Units by mouth.      famotidine (PEPCID) 20 MG tablet Take 20 mg by mouth daily as needed.      FLUoxetine (PROZAC) 10 MG capsule Take 20 mg by mouth daily.      fluticasone (FLONASE) 50 MCG/ACT nasal spray Place 2 sprays into both nostrils daily. 16 g 6   Krill Oil 1000 MG CAPS Take 1 capsule by mouth daily.     Multiple Vitamin (MULTIVITAMIN) capsule Take 1 capsule by mouth daily.     potassium chloride (MICRO-K) 10 MEQ CR  capsule TAKE 1 CAPSULE EVERY DAY 90 capsule 1   SYNTHROID 50 MCG tablet TAKE 1 TABLET DAILY BEFORE BREAKFAST 90 tablet 3   traZODone (DESYREL) 50 MG tablet Take by mouth. 1 1/2 to 2 tablets at bedtime     No facility-administered medications prior to visit.    Allergies  Allergen Reactions   Bystolic [Nebivolol Hcl] Other (See Comments)    Bradycardia   Aricept [Donepezil]     Nausea and vomiting.   Lisinopril Swelling   Other     Other reaction(s): GI Upset (intolerance)   Oxycodone     rash   Propoxyphene Other (See Comments)    Review of Systems  Constitutional:  Negative for chills, fatigue and fever.  HENT:  Negative for congestion, rhinorrhea and sore throat.   Respiratory:  Positive for cough. Negative for shortness of breath.   Cardiovascular:  Negative for chest pain.  Gastrointestinal:  Negative for abdominal pain, diarrhea, nausea and vomiting.  Genitourinary:  Negative for dysuria and urgency.  Musculoskeletal:  Positive for back pain. Negative for myalgias.  Neurological:  Negative for  dizziness, weakness, light-headedness and headaches.  Psychiatric/Behavioral:  Negative for dysphoric mood. The patient is not nervous/anxious.       Objective:    Physical Exam Vitals reviewed.  Constitutional:      Appearance: Normal appearance. She is normal weight.  Cardiovascular:     Rate and Rhythm: Normal rate. Rhythm irregular.     Heart sounds: Normal heart sounds.  Pulmonary:     Effort: Pulmonary effort is normal. No respiratory distress.     Breath sounds: Normal breath sounds.  Neurological:     Mental Status: She is alert.  Psychiatric:        Mood and Affect: Mood normal.        Behavior: Behavior normal.    BP 128/78    Pulse 88    Temp 98.6 F (37 C)    Resp 16    Wt 150 lb (68 kg)    BMI 28.34 kg/m  Wt Readings from Last 3 Encounters:  06/09/21 150 lb (68 kg)  04/28/21 153 lb (69.4 kg)  11/25/20 150 lb (68 kg)    Health Maintenance Due  Topic Date Due   COVID-19 Vaccine (1) Never done   Zoster Vaccines- Shingrix (1 of 2) Never done   MAMMOGRAM  07/18/2018   DEXA SCAN  05/08/2020    There are no preventive care reminders to display for this patient.   Lab Results  Component Value Date   TSH 3.950 04/28/2021   Lab Results  Component Value Date   WBC 7.3 04/28/2021   HGB 14.3 04/28/2021   HCT 42.4 04/28/2021   MCV 88 04/28/2021   PLT 183 04/28/2021   Lab Results  Component Value Date   NA 137 04/28/2021   K 4.3 04/28/2021   CO2 22 04/28/2021   GLUCOSE 116 (H) 04/28/2021   BUN 20 04/28/2021   CREATININE 1.12 (H) 04/28/2021   BILITOT 0.8 04/28/2021   ALKPHOS 78 04/28/2021   AST 24 04/28/2021   ALT 20 04/28/2021   PROT 6.4 04/28/2021   ALBUMIN 4.6 04/28/2021   CALCIUM 10.2 04/28/2021   EGFR 49 (L) 04/28/2021   GFR 89.64 07/20/2013   Lab Results  Component Value Date   CHOL 245 (H) 11/25/2020   Lab Results  Component Value Date   HDL 60 11/25/2020   Lab Results  Component Value Date   LDLCALC  142 (H) 11/25/2020   Lab  Results  Component Value Date   TRIG 242 (H) 11/25/2020   Lab Results  Component Value Date   CHOLHDL 4.1 11/25/2020   Lab Results  Component Value Date   HGBA1C 6.0 (H) 11/25/2020       Assessment & Plan:   Problem List Items Addressed This Visit       Cardiovascular and Mediastinum   PVC's (premature ventricular contractions) - Primary    Unifocal PVCs every 5th beat approximately.  Asymptomatic.  I do not recommend treatment.       Relevant Orders   EKG 12-Lead   No orders of the defined types were placed in this encounter.   Orders Placed This Encounter  Procedures   EKG 12-Lead     Follow-up: No follow-ups on file.  An After Visit Summary was printed and given to the patient.  [name], MD [facility] [phone]

## 2021-06-15 ENCOUNTER — Encounter: Payer: Self-pay | Admitting: Family Medicine

## 2021-06-15 DIAGNOSIS — I493 Ventricular premature depolarization: Secondary | ICD-10-CM | POA: Insufficient documentation

## 2021-06-15 HISTORY — DX: Ventricular premature depolarization: I49.3

## 2021-06-15 NOTE — Assessment & Plan Note (Signed)
Unifocal PVCs every 5th beat approximately.  Asymptomatic.  I do not recommend treatment.

## 2021-07-03 DIAGNOSIS — D518 Other vitamin B12 deficiency anemias: Secondary | ICD-10-CM | POA: Diagnosis not present

## 2021-07-03 DIAGNOSIS — E038 Other specified hypothyroidism: Secondary | ICD-10-CM | POA: Diagnosis not present

## 2021-07-03 DIAGNOSIS — Z79899 Other long term (current) drug therapy: Secondary | ICD-10-CM | POA: Diagnosis not present

## 2021-07-03 DIAGNOSIS — E559 Vitamin D deficiency, unspecified: Secondary | ICD-10-CM | POA: Diagnosis not present

## 2021-07-03 DIAGNOSIS — E119 Type 2 diabetes mellitus without complications: Secondary | ICD-10-CM | POA: Diagnosis not present

## 2021-07-03 DIAGNOSIS — E7849 Other hyperlipidemia: Secondary | ICD-10-CM | POA: Diagnosis not present

## 2021-07-17 ENCOUNTER — Telehealth: Payer: Self-pay

## 2021-07-17 NOTE — Chronic Care Management (AMB) (Signed)
° ° °  Chronic Care Management Pharmacy Assistant   Name: Michele Parker  MRN: [id number] DOB: 15-Nov-1940   Reason for Encounter: General Adherence Call    Recent office visits:  06/09/21 [name]. MD. Seen for Irregular heart beat. No med changes.   Recent consult visits:  06/08/21 (Psychiatric) [name] MD. Seen for depression. Ordered Prozac 10mg  and Trazodone 50mg . D/C Wellbutrin 150mg , Exelon 4.6 mg, Zyprexa 5mg .   Hospital visits:  None  Medications: Outpatient Encounter Medications as of 07/17/2021  Medication Sig   benazepril-hydrochlorthiazide (LOTENSIN HCT) 20-12.5 MG tablet Take 1 tablet by mouth 2 (two) times daily.   cetirizine (ZYRTEC) 10 MG tablet Take 10 mg by mouth daily.   Cholecalciferol (VITAMIN D3) 25 MCG (1000 UT) CAPS Take 1,000 Units by mouth.    famotidine (PEPCID) 20 MG tablet Take 20 mg by mouth daily as needed.    FLUoxetine (PROZAC) 10 MG capsule Take 20 mg by mouth daily.    fluticasone (FLONASE) 50 MCG/ACT nasal spray Place 2 sprays into both nostrils daily.   Krill Oil 1000 MG CAPS Take 1 capsule by mouth daily.   Multiple Vitamin (MULTIVITAMIN) capsule Take 1 capsule by mouth daily.   potassium chloride (MICRO-K) 10 MEQ CR capsule TAKE 1 CAPSULE EVERY DAY   SYNTHROID 50 MCG tablet TAKE 1 TABLET DAILY BEFORE BREAKFAST   traZODone (DESYREL) 50 MG tablet Take by mouth. 1 1/2 to 2 tablets at bedtime   No facility-administered encounter medications on file as of 07/17/2021.    [place] for general disease state and medication adherence call.   Patient is not > 5 days past due for refill on the following medications per chart history:  [facility] Medications: Medication Name/mg Last Fill Days Supply Benazepril-HCTZ 20-12.5mg  05/20/21 90ds     05/19/21 30ds  Pt is now living in an assisted living facility and pt daughter stated this will probably turn permanent and will more than likely have to d/c our services.   Care Gaps: Last  annual wellness visit?None noted  Mammogram: 07/18/17 Dexa Scan: 05/08/18 Last eye exam / retinopathy screening?None Diabetic foot exam?None  Elray Mcgregor, [place] Pharmacist Assistant  [phone]

## 2021-07-24 DIAGNOSIS — F32A Depression, unspecified: Secondary | ICD-10-CM | POA: Diagnosis not present

## 2021-07-24 DIAGNOSIS — G3183 Dementia with Lewy bodies: Secondary | ICD-10-CM | POA: Diagnosis not present

## 2021-07-24 DIAGNOSIS — F0283 Dementia in other diseases classified elsewhere, unspecified severity, with mood disturbance: Secondary | ICD-10-CM | POA: Diagnosis not present

## 2021-07-24 DIAGNOSIS — E785 Hyperlipidemia, unspecified: Secondary | ICD-10-CM | POA: Diagnosis not present

## 2021-07-24 DIAGNOSIS — D518 Other vitamin B12 deficiency anemias: Secondary | ICD-10-CM | POA: Diagnosis not present

## 2021-07-24 DIAGNOSIS — E119 Type 2 diabetes mellitus without complications: Secondary | ICD-10-CM | POA: Diagnosis not present

## 2021-07-24 DIAGNOSIS — E039 Hypothyroidism, unspecified: Secondary | ICD-10-CM | POA: Diagnosis not present

## 2021-07-24 DIAGNOSIS — E559 Vitamin D deficiency, unspecified: Secondary | ICD-10-CM | POA: Diagnosis not present

## 2021-07-24 DIAGNOSIS — E038 Other specified hypothyroidism: Secondary | ICD-10-CM | POA: Diagnosis not present

## 2021-07-30 ENCOUNTER — Ambulatory Visit: Payer: Medicare HMO | Admitting: Family Medicine

## 2021-07-31 DIAGNOSIS — R109 Unspecified abdominal pain: Secondary | ICD-10-CM | POA: Diagnosis not present

## 2021-08-04 DIAGNOSIS — F3341 Major depressive disorder, recurrent, in partial remission: Secondary | ICD-10-CM | POA: Diagnosis not present

## 2021-08-04 DIAGNOSIS — F411 Generalized anxiety disorder: Secondary | ICD-10-CM | POA: Diagnosis not present

## 2021-08-04 DIAGNOSIS — Z9889 Other specified postprocedural states: Secondary | ICD-10-CM | POA: Diagnosis not present

## 2021-08-04 DIAGNOSIS — F03B0 Unspecified dementia, moderate, without behavioral disturbance, psychotic disturbance, mood disturbance, and anxiety: Secondary | ICD-10-CM | POA: Diagnosis not present

## 2021-08-05 DIAGNOSIS — D518 Other vitamin B12 deficiency anemias: Secondary | ICD-10-CM | POA: Diagnosis not present

## 2021-08-05 DIAGNOSIS — Z79899 Other long term (current) drug therapy: Secondary | ICD-10-CM | POA: Diagnosis not present

## 2021-08-12 ENCOUNTER — Other Ambulatory Visit: Payer: Self-pay | Admitting: Oncology

## 2021-08-12 NOTE — Progress Notes (Signed)
?[facility]  ?[address] ?[place],  [place]  [address] ?[phone] ? ?Clinic Day:  08/13/2021 ? ?Referring physician: [name], MD ? ? ?HISTORY OF PRESENT ILLNESS:  ?The patient is an 81 y.o. female with an elevated ferritin related to her underlying hemochromatosis (H63D/H63D).  She comes back in today as recent labs at her primary care office showed an elevated ferritin of 548.  However, other iron parameters were essentially normal, which included a serum iron of 123, a mildly low total iron binding capacity of 248, and an iron saturation of 50%.  Of note, this patient was last seen by me in 2019.  At that time, she was getting phlebotomized to keep her ferritin levels closer to 100.  However, since that last visit, her medical issues have become more prominent, including dementia.  At this visit today, she was not particularly verbal.  She did not seemingly understand the reason as to why she was here today.  Most of the conversation today was between the patient's daughter and myself.  Her daughter denies there being any particular effects on her baseline health as it pertains to her known hemochromatosis. ? ?PHYSICAL EXAM:  ?Blood pressure (!) 157/70, pulse (!) 42, temperature 99.1 ?F (37.3 ?C), resp. rate 14, height '5\' 1"'$  (1.549 m), weight 145 lb 9.6 oz (66 kg), SpO2 96 %. ?Wt Readings from Last 3 Encounters:  ?08/13/21 145 lb 9.6 oz (66 kg)  ?06/09/21 150 lb (68 kg)  ?04/28/21 153 lb (69.4 kg)  ? ?Body mass index is 27.51 kg/m?[name] ?Performance status (ECOG): 3 - Symptomatic, >50% confined to bed ?Physical Exam ?Constitutional:   ?   Appearance: Normal appearance. She is not ill-appearing.  ?   Comments: Chronically ill-appearing woman who is in wheelchair.  She appears to have significant dementia.  ?HENT:  ?   Mouth/Throat:  ?   Mouth: Mucous membranes are moist.  ?   Pharynx: Oropharynx is clear. No oropharyngeal exudate or posterior oropharyngeal erythema.   ?Cardiovascular:  ?   Rate and Rhythm: Normal rate and regular rhythm.  ?   Heart sounds: No murmur heard. ?  No friction rub. No gallop.  ?Pulmonary:  ?   Effort: Pulmonary effort is normal. No respiratory distress.  ?   Breath sounds: Normal breath sounds. No wheezing, rhonchi or rales.  ?Abdominal:  ?   General: Bowel sounds are normal. There is no distension.  ?   Palpations: Abdomen is soft. There is no mass.  ?   Tenderness: There is no abdominal tenderness.  ?Musculoskeletal:     ?   General: No swelling.  ?   Right lower leg: No edema.  ?   Left lower leg: No edema.  ?Lymphadenopathy:  ?   Cervical: No cervical adenopathy.  ?   Upper Body:  ?   Right upper body: No supraclavicular or axillary adenopathy.  ?   Left upper body: No supraclavicular or axillary adenopathy.  ?   Lower Body: No right inguinal adenopathy. No left inguinal adenopathy.  ?Skin: ?   General: Skin is warm.  ?   Coloration: Skin is not jaundiced.  ?   Findings: No lesion or rash.  ?Neurological:  ?   General: No focal deficit present.  ?   Mental Status: She is alert and oriented to person, place, and time. Mental status is at baseline.  ?Psychiatric:     ?   Mood and Affect: Mood normal.     ?  Behavior: Behavior normal.     ?   Thought Content: Thought content normal.  ? ?ASSESSMENT & PLAN:  ?Assessment/Plan:  An 81 y.o. female with hemochromatosis (H63D/H63D).  As mentioned previously, she was brought back into clinic for her elevated ferritin level, for which a significant component of this is likely related to her hemochromatosis.  However, I reemphasized to the patient's daughter that her hemochromatosis mutation is not the one  usually associated with severe iron overload that can lead to organ damage.  This is usually seen in the hemochromatosis patient who carries the C282Y mutation.  Patients homozygous for the H63D mutation can occasionally receive phlebotomies, but the likelihood of iron overload is extremely low in one's  lifetime.  When factoring this in with other medical issues, including her severe dementia, I do not believe it is worth bringing this patient back in to have routine phlebotomies.  My recommendation would be for primary care office to check her iron parameters every 6 to 12 months.  If there is a precipitous rise in these levels, I would consider reassessing her clinical picture.  However, I believe her dementia essentially overrides any type of plans/decisions as it pertains to her hemochromatosis.  The patient and her daughter understand all the plans discussed today and are in agreement with them.   ? ? ? ?Ramiyah Mcclenahan Macarthur Critchley, MD   ? ? ?  ?

## 2021-08-13 ENCOUNTER — Inpatient Hospital Stay: Payer: Medicare HMO

## 2021-08-13 ENCOUNTER — Inpatient Hospital Stay: Payer: Medicare HMO | Attending: Oncology | Admitting: Oncology

## 2021-08-13 ENCOUNTER — Other Ambulatory Visit: Payer: Self-pay

## 2021-08-13 DIAGNOSIS — F03C0 Unspecified dementia, severe, without behavioral disturbance, psychotic disturbance, mood disturbance, and anxiety: Secondary | ICD-10-CM | POA: Diagnosis not present

## 2021-08-20 DIAGNOSIS — E039 Hypothyroidism, unspecified: Secondary | ICD-10-CM | POA: Diagnosis not present

## 2021-08-20 DIAGNOSIS — E119 Type 2 diabetes mellitus without complications: Secondary | ICD-10-CM | POA: Diagnosis not present

## 2021-08-20 DIAGNOSIS — E038 Other specified hypothyroidism: Secondary | ICD-10-CM | POA: Diagnosis not present

## 2021-08-20 DIAGNOSIS — I10 Essential (primary) hypertension: Secondary | ICD-10-CM | POA: Diagnosis not present

## 2021-08-20 DIAGNOSIS — E559 Vitamin D deficiency, unspecified: Secondary | ICD-10-CM | POA: Diagnosis not present

## 2021-08-20 DIAGNOSIS — D518 Other vitamin B12 deficiency anemias: Secondary | ICD-10-CM | POA: Diagnosis not present

## 2021-08-20 DIAGNOSIS — E785 Hyperlipidemia, unspecified: Secondary | ICD-10-CM | POA: Diagnosis not present

## 2021-08-27 ENCOUNTER — Emergency Department (HOSPITAL_BASED_OUTPATIENT_CLINIC_OR_DEPARTMENT_OTHER): Payer: Medicare HMO

## 2021-08-27 ENCOUNTER — Other Ambulatory Visit: Payer: Self-pay

## 2021-08-27 ENCOUNTER — Emergency Department (HOSPITAL_BASED_OUTPATIENT_CLINIC_OR_DEPARTMENT_OTHER)
Admission: EM | Admit: 2021-08-27 | Discharge: 2021-08-27 | Disposition: A | Discharge status: Home / Self Care | Payer: Medicare HMO | Attending: Emergency Medicine | Admitting: Emergency Medicine

## 2021-08-27 ENCOUNTER — Encounter (HOSPITAL_BASED_OUTPATIENT_CLINIC_OR_DEPARTMENT_OTHER): Payer: Self-pay | Admitting: Emergency Medicine

## 2021-08-27 DIAGNOSIS — R4182 Altered mental status, unspecified: Secondary | ICD-10-CM | POA: Insufficient documentation

## 2021-08-27 DIAGNOSIS — E039 Hypothyroidism, unspecified: Secondary | ICD-10-CM | POA: Insufficient documentation

## 2021-08-27 DIAGNOSIS — F0390 Unspecified dementia without behavioral disturbance: Secondary | ICD-10-CM | POA: Insufficient documentation

## 2021-08-27 DIAGNOSIS — R3915 Urgency of urination: Secondary | ICD-10-CM | POA: Diagnosis not present

## 2021-08-27 DIAGNOSIS — I10 Essential (primary) hypertension: Secondary | ICD-10-CM | POA: Diagnosis not present

## 2021-08-27 DIAGNOSIS — R3912 Poor urinary stream: Secondary | ICD-10-CM | POA: Diagnosis not present

## 2021-08-27 DIAGNOSIS — M549 Dorsalgia, unspecified: Secondary | ICD-10-CM | POA: Insufficient documentation

## 2021-08-27 DIAGNOSIS — R109 Unspecified abdominal pain: Secondary | ICD-10-CM | POA: Diagnosis not present

## 2021-08-27 DIAGNOSIS — R441 Visual hallucinations: Secondary | ICD-10-CM | POA: Diagnosis not present

## 2021-08-27 DIAGNOSIS — K449 Diaphragmatic hernia without obstruction or gangrene: Secondary | ICD-10-CM | POA: Diagnosis not present

## 2021-08-27 LAB — CBC WITH DIFFERENTIAL/PLATELET
Abs Immature Granulocytes: 0.04 10*3/uL (ref 0.00–0.07)
Basophils Absolute: 0 10*3/uL (ref 0.0–0.1)
Basophils Relative: 0 %
Eosinophils Absolute: 0.1 10*3/uL (ref 0.0–0.5)
Eosinophils Relative: 1 %
HCT: 37 % (ref 36.0–46.0)
Hemoglobin: 13.2 g/dL (ref 12.0–15.0)
Immature Granulocytes: 1 %
Lymphocytes Relative: 23 %
Lymphs Abs: 1.8 10*3/uL (ref 0.7–4.0)
MCH: 30.5 pg (ref 26.0–34.0)
MCHC: 35.7 g/dL (ref 30.0–36.0)
MCV: 85.5 fL (ref 80.0–100.0)
Monocytes Absolute: 0.9 10*3/uL (ref 0.1–1.0)
Monocytes Relative: 11 %
Neutro Abs: 5.1 10*3/uL (ref 1.7–7.7)
Neutrophils Relative %: 64 %
Platelets: 234 10*3/uL (ref 150–400)
RBC: 4.33 MIL/uL (ref 3.87–5.11)
RDW: 13 % (ref 11.5–15.5)
WBC: 7.9 10*3/uL (ref 4.0–10.5)
nRBC: 0 % (ref 0.0–0.2)

## 2021-08-27 LAB — URINALYSIS, ROUTINE W REFLEX MICROSCOPIC
Bilirubin Urine: NEGATIVE
Glucose, UA: NEGATIVE mg/dL
Hgb urine dipstick: NEGATIVE
Ketones, ur: NEGATIVE mg/dL
Leukocytes,Ua: NEGATIVE
Nitrite: NEGATIVE
Protein, ur: NEGATIVE mg/dL
Specific Gravity, Urine: 1.015 (ref 1.005–1.030)
pH: 7 (ref 5.0–8.0)

## 2021-08-27 LAB — COMPREHENSIVE METABOLIC PANEL
ALT: 21 U/L (ref 0–44)
AST: 27 U/L (ref 15–41)
Albumin: 4.1 g/dL (ref 3.5–5.0)
Alkaline Phosphatase: 48 U/L (ref 38–126)
Anion gap: 9 (ref 5–15)
BUN: 17 mg/dL (ref 8–23)
CO2: 26 mmol/L (ref 22–32)
Calcium: 9.9 mg/dL (ref 8.9–10.3)
Chloride: 97 mmol/L — ABNORMAL LOW (ref 98–111)
Creatinine, Ser: 0.9 mg/dL (ref 0.44–1.00)
GFR, Estimated: >60 mL/min (ref >60)
Glucose, Bld: 113 mg/dL — ABNORMAL HIGH (ref 70–99)
Potassium: 3.9 mmol/L (ref 3.5–5.1)
Sodium: 132 mmol/L — ABNORMAL LOW (ref 135–145)
Total Bilirubin: 1 mg/dL (ref 0.3–1.2)
Total Protein: 6.7 g/dL (ref 6.5–8.1)

## 2021-08-27 LAB — LIPASE, BLOOD: Lipase: 32 U/L (ref 11–51)

## 2021-08-27 MED ORDER — CEPHALEXIN 500 MG PO CAPS: 500.0000 mg | ORAL_CAPSULE | Freq: Two times a day (BID) | ORAL | 0 refills | Status: AC

## 2021-08-27 MED ORDER — IOHEXOL 300 MG/ML  SOLN
100.0000 mL | Freq: Once | INTRAMUSCULAR | Status: AC | PRN
  Administered 2021-08-27: 100 mL via INTRAVENOUS

## 2021-08-27 MED ORDER — SODIUM CHLORIDE 0.9 % IV BOLUS
500.0000 mL | Freq: Once | INTRAVENOUS | Status: AC
  Administered 2021-08-27: 500 mL via INTRAVENOUS

## 2021-08-27 MED ORDER — CEPHALEXIN 250 MG PO CAPS
500.0000 mg | ORAL_CAPSULE | Freq: Once | ORAL | Status: AC
  Administered 2021-08-27: 500 mg via ORAL
  Filled 2021-08-27: qty 2

## 2021-08-27 NOTE — ED Triage Notes (Signed)
Patient arrives POV with her daughter. States patient has been staying at [facility] assisted living. Staff called daughter reporting patient has not been acting herself, not eating and having difficulty urinating x 2 days. Daughter states pt has been c/o back pain for a while and recently had urine specimen done. Also has been having PT for it. Daughter reports pt has become a lot more anxious and jittery.  ?

## 2021-08-27 NOTE — ED Notes (Signed)
Pt called out saying she had to use the restroom, ambulatory to bedside commode. Sat on commode and passed gas, stating shes "not able to go". When asked if she had to pee or poop, pt states "I think both". Scanned bladder and got 210 ml & 311 ml. Dr. Laverta Baltimore and primary nurse notified. ?

## 2021-08-27 NOTE — Discharge Instructions (Addendum)
Seen in the emergency room today with urinary urgency.  If you have worsening abdominal pain or are unable to urinate at all over an 8 to 10-hour period of time he should return to the emergency department to be reevaluated and potentially place a Foley catheter at that time. ?

## 2021-08-27 NOTE — ED Provider Notes (Signed)
? ?Emergency Department Provider Note ? ? ?I have reviewed the triage vital signs and the nursing notes. ? ? ?HISTORY ? ?Chief Complaint ?Urinary Retention ? ? ?HPI ?Michele Parker is a 81 y.o. female with past medical history reviewed below presents to the emergency department for evaluation of back pain and decreased urine output, increased "fidgeting" type movements. Symptoms seems to be worsening over the last several days.  Patient's daughter is at bedside and provides most of the history with the patient's underlying dementia.  She is currently at a small nursing facility.  Daughter states that she been complaining of back pain for several weeks and staff has consider doing x-rays but nothing has been done as of yet.  No new medications.  Her only psychiatric medication at this time is Prozac which she has been on for many months.  Daughter notes that she occasionally has visual hallucinations with urine infection but no significant symptoms recently. ? ? ?Past Medical History:  ?Diagnosis Date  ? Angioedema   ? Bradycardia   ? Cervicalgia   ? Depression   ? Essential hypertension   ? Hemochromatosis   ? Hemorrhoid   ? Hereditary hemochromatosis ([place])   ? HTN (hypertension)   ? Hypercalcemia   ? Hyperlipidemia   ? Hypothyroid   ? Memory loss   ? Osteopenia   ? Osteopenia   ? Other amnesia   ? Other constipation   ? Repeated falls   ? Tremor   ? Vitamin D deficiency   ? ? ?Review of Systems ? ?Constitutional: No fever/chills ?Cardiovascular: Denies chest pain. ?Respiratory: Denies shortness of breath. ?Gastrointestinal: No vomiting or diarrhea. Constipation has since resolved.  ?Genitourinary: Decreased urine output.  ?Musculoskeletal: Positive for back pain. ?Skin: Negative for rash. ?Neurological: Positive new hand/feet movements.  ? ?____________________________________________ ? ? ?PHYSICAL EXAM: ? ?VITAL SIGNS: ?ED Triage Vitals  ?Enc Vitals Group  ?   BP 08/27/21 1524 (!) 158/119  ?   Pulse Rate 08/27/21  1524 (!) 35  ?   Resp 08/27/21 1524 20  ?   Temp 08/27/21 1524 98.3 ?F (36.8 ?C)  ?   Temp Source 08/27/21 1524 Oral  ?   SpO2 08/27/21 1524 98 %  ?   Weight 08/27/21 1525 145 lb (65.8 kg)  ?   Height 08/27/21 1525 '5\' 1"'$  (1.549 m)  ? ?Constitutional: Alert. No acute distress.  ?Eyes: Conjunctivae are normal.  ?Head: Atraumatic. ?Nose: No congestion/rhinnorhea. ?Mouth/Throat: Mucous membranes are moist.   ?Neck: No stridor.   ?Cardiovascular: Normal rate, regular rhythm. Good peripheral circulation. Grossly normal heart sounds.   ?Respiratory: Normal respiratory effort.  No retractions. Lungs CTAB. ?Gastrointestinal: Soft and nontender. No distention.  ?Musculoskeletal: No lower extremity tenderness nor edema. No gross deformities of extremities. ?Neurologic:  Normal speech and language.  Patient frequently moving hands and feet but strength is equal in the bilateral upper and lower extremities.  No facial asymmetry.  No facial twitching or other myoclonic jerks. ?Skin:  Skin is warm, dry and intact. No rash noted. ? ? ?____________________________________________ ?  ?LABS ?(all labs ordered are listed, but only abnormal results are displayed) ? ?Labs Reviewed  ?COMPREHENSIVE METABOLIC PANEL - Abnormal; Notable for the following components:  ?    Result Value  ? Sodium 132 (*)   ? Chloride 97 (*)   ? Glucose, Bld 113 (*)   ? All other components within normal limits  ?URINE CULTURE  ?LIPASE, BLOOD  ?CBC WITH DIFFERENTIAL/PLATELET  ?  URINALYSIS, ROUTINE W REFLEX MICROSCOPIC  ? ?____________________________________________ ? ?EKG ? ?None ? ? ?____________________________________________ ? ? ?PROCEDURES ? ?Procedure(s) performed:  ? ?Procedures ? ?None ?____________________________________________ ? ? ?INITIAL IMPRESSION / ASSESSMENT AND PLAN / ED COURSE ? ?Pertinent labs & imaging results that were available during my care of the patient were reviewed by me and considered in my medical decision making (see chart for  details). ?  ?This patient is Presenting for Evaluation of AMS, which does require a range of treatment options, and is a complaint that involves a high risk of morbidity and mortality. ? ?The Differential Diagnoses includes but is not exclusive to alcohol, illicit or prescription medications, intracranial pathology such as stroke, intracerebral hemorrhage, fever or infectious causes including sepsis, hypoxemia, uremia, trauma, endocrine related disorders such as diabetes, hypoglycemia, thyroid-related diseases, etc. ? ? ?Critical Interventions-  ?  ?Medications  ?sodium chloride 0.9 % bolus 500 mL (0 mLs Intravenous Stopped 08/27/21 1752)  ?iohexol (OMNIPAQUE) 300 MG/ML solution 100 mL (100 mLs Intravenous Contrast Given 08/27/21 1650)  ?cephALEXin (KEFLEX) capsule 500 mg (500 mg Oral Given 08/27/21 1939)  ? ? ?Reassessment after intervention: Patient improved after foley and IVF. ? ? ?I did obtain Additional Historical Information from daughter.  ? ?  ?Clinical Laboratory Tests Ordered, included no evidence of leukocytosis or urinary tract infection.  No acute kidney injury. ? ?Radiologic Tests Ordered, included CT abdomen/pelvis and CT head. I independently interpreted the images and agree with radiology interpretation.  ? ?Cardiac Monitor Tracing which shows NSR. ? ?Medical Decision Making: Summary:  ?Patient presents emergency department with altered mental status and decreased urine output.  Will eval for urinary retention but unclear if patient is retaining versus decreased output from decreased oral intake.  Abdomen mildly tender in patient herself describes some pain in her back.  Plan for CT imaging of the abdomen/pelvis.  With mental status change also plan for CT of the head.  ? ?Reevaluation with update and discussion with daughter.  Patient has some continued fidgeting type symptoms.  In and out cath revealed around 300 mL of urine.  We discussed placing a Foley catheter and sending her home with this but  the daughter is concerned that she would not tolerate this well and with her dementia will likely pull it out.  She does not appear to have acute urinary retention here in the emergency department but we did discuss that this is a risk and that she should return immediately if abdominal pain or inability to urinate continue.  Daughter understands the risk of bleeding without placing the Foley but will plan for Keflex given some urine urgency.  ? ?Disposition: discharge ? ?____________________________________________ ? ?FINAL CLINICAL IMPRESSION(S) / ED DIAGNOSES ? ?Final diagnoses:  ?Urinary urgency  ? ? ? ?NEW OUTPATIENT MEDICATIONS STARTED DURING THIS VISIT: ? ?Discharge Medication List as of 08/27/2021  7:33 PM  ?  ? ?START taking these medications  ? Details  ?cephALEXin (KEFLEX) 500 MG capsule Take 1 capsule (500 mg total) by mouth 2 (two) times daily for 7 days., Starting Thu 08/27/2021, Until Thu 09/03/2021, Print  ?  ?  ? ? ?Note:  This document was prepared using Dragon voice recognition software and may include unintentional dictation errors. ? ?Nanda Quinton, MD, FACEP ?Emergency Medicine ? ?  ?Margette Fast, MD ?08/28/21 2347 ? ?

## 2021-08-28 ENCOUNTER — Telehealth (HOSPITAL_BASED_OUTPATIENT_CLINIC_OR_DEPARTMENT_OTHER): Payer: Self-pay | Admitting: Emergency Medicine

## 2021-08-28 LAB — URINE CULTURE: Culture: NO GROWTH

## 2021-08-29 DIAGNOSIS — K5900 Constipation, unspecified: Secondary | ICD-10-CM | POA: Diagnosis not present

## 2021-08-29 DIAGNOSIS — I10 Essential (primary) hypertension: Secondary | ICD-10-CM | POA: Diagnosis not present

## 2021-08-29 DIAGNOSIS — R251 Tremor, unspecified: Secondary | ICD-10-CM | POA: Diagnosis not present

## 2021-08-29 DIAGNOSIS — D320 Benign neoplasm of cerebral meninges: Secondary | ICD-10-CM | POA: Diagnosis not present

## 2021-08-29 DIAGNOSIS — G459 Transient cerebral ischemic attack, unspecified: Secondary | ICD-10-CM | POA: Diagnosis not present

## 2021-08-29 DIAGNOSIS — F411 Generalized anxiety disorder: Secondary | ICD-10-CM | POA: Diagnosis not present

## 2021-08-29 DIAGNOSIS — G319 Degenerative disease of nervous system, unspecified: Secondary | ICD-10-CM | POA: Diagnosis not present

## 2021-08-29 DIAGNOSIS — G9341 Metabolic encephalopathy: Secondary | ICD-10-CM | POA: Diagnosis not present

## 2021-08-29 DIAGNOSIS — K449 Diaphragmatic hernia without obstruction or gangrene: Secondary | ICD-10-CM | POA: Diagnosis not present

## 2021-08-29 DIAGNOSIS — K838 Other specified diseases of biliary tract: Secondary | ICD-10-CM | POA: Diagnosis not present

## 2021-08-29 DIAGNOSIS — R29810 Facial weakness: Secondary | ICD-10-CM | POA: Diagnosis not present

## 2021-08-29 DIAGNOSIS — I6782 Cerebral ischemia: Secondary | ICD-10-CM | POA: Diagnosis not present

## 2021-08-29 DIAGNOSIS — R29818 Other symptoms and signs involving the nervous system: Secondary | ICD-10-CM | POA: Diagnosis not present

## 2021-08-29 DIAGNOSIS — Z9049 Acquired absence of other specified parts of digestive tract: Secondary | ICD-10-CM | POA: Diagnosis not present

## 2021-08-29 DIAGNOSIS — F03B0 Unspecified dementia, moderate, without behavioral disturbance, psychotic disturbance, mood disturbance, and anxiety: Secondary | ICD-10-CM | POA: Diagnosis not present

## 2021-08-29 DIAGNOSIS — F4310 Post-traumatic stress disorder, unspecified: Secondary | ICD-10-CM | POA: Diagnosis not present

## 2021-08-29 DIAGNOSIS — R531 Weakness: Secondary | ICD-10-CM | POA: Diagnosis not present

## 2021-08-29 DIAGNOSIS — G9349 Other encephalopathy: Secondary | ICD-10-CM | POA: Diagnosis not present

## 2021-08-29 DIAGNOSIS — F3341 Major depressive disorder, recurrent, in partial remission: Secondary | ICD-10-CM | POA: Diagnosis not present

## 2021-08-29 DIAGNOSIS — R918 Other nonspecific abnormal finding of lung field: Secondary | ICD-10-CM | POA: Diagnosis not present

## 2021-08-30 DIAGNOSIS — F411 Generalized anxiety disorder: Secondary | ICD-10-CM | POA: Diagnosis not present

## 2021-08-30 DIAGNOSIS — Z9049 Acquired absence of other specified parts of digestive tract: Secondary | ICD-10-CM | POA: Diagnosis not present

## 2021-08-30 DIAGNOSIS — G459 Transient cerebral ischemic attack, unspecified: Secondary | ICD-10-CM | POA: Diagnosis not present

## 2021-08-30 DIAGNOSIS — K5900 Constipation, unspecified: Secondary | ICD-10-CM | POA: Diagnosis not present

## 2021-08-30 DIAGNOSIS — G319 Degenerative disease of nervous system, unspecified: Secondary | ICD-10-CM | POA: Diagnosis not present

## 2021-08-30 DIAGNOSIS — I082 Rheumatic disorders of both aortic and tricuspid valves: Secondary | ICD-10-CM | POA: Diagnosis not present

## 2021-08-30 DIAGNOSIS — I6782 Cerebral ischemia: Secondary | ICD-10-CM | POA: Diagnosis not present

## 2021-08-30 DIAGNOSIS — K449 Diaphragmatic hernia without obstruction or gangrene: Secondary | ICD-10-CM | POA: Diagnosis not present

## 2021-08-30 DIAGNOSIS — K838 Other specified diseases of biliary tract: Secondary | ICD-10-CM | POA: Diagnosis not present

## 2021-08-30 DIAGNOSIS — D320 Benign neoplasm of cerebral meninges: Secondary | ICD-10-CM | POA: Diagnosis not present

## 2021-08-30 DIAGNOSIS — F3341 Major depressive disorder, recurrent, in partial remission: Secondary | ICD-10-CM | POA: Diagnosis not present

## 2021-08-30 DIAGNOSIS — G9341 Metabolic encephalopathy: Secondary | ICD-10-CM | POA: Diagnosis not present

## 2021-08-30 DIAGNOSIS — F03B0 Unspecified dementia, moderate, without behavioral disturbance, psychotic disturbance, mood disturbance, and anxiety: Secondary | ICD-10-CM | POA: Diagnosis not present

## 2021-08-30 DIAGNOSIS — I10 Essential (primary) hypertension: Secondary | ICD-10-CM | POA: Diagnosis not present

## 2021-08-31 DIAGNOSIS — G4700 Insomnia, unspecified: Secondary | ICD-10-CM

## 2021-08-31 DIAGNOSIS — J309 Allergic rhinitis, unspecified: Secondary | ICD-10-CM | POA: Insufficient documentation

## 2021-08-31 DIAGNOSIS — D518 Other vitamin B12 deficiency anemias: Secondary | ICD-10-CM | POA: Insufficient documentation

## 2021-08-31 DIAGNOSIS — F3341 Major depressive disorder, recurrent, in partial remission: Secondary | ICD-10-CM | POA: Diagnosis not present

## 2021-08-31 DIAGNOSIS — E559 Vitamin D deficiency, unspecified: Secondary | ICD-10-CM | POA: Insufficient documentation

## 2021-08-31 DIAGNOSIS — I10 Essential (primary) hypertension: Secondary | ICD-10-CM | POA: Diagnosis not present

## 2021-08-31 DIAGNOSIS — F03B0 Unspecified dementia, moderate, without behavioral disturbance, psychotic disturbance, mood disturbance, and anxiety: Secondary | ICD-10-CM | POA: Diagnosis not present

## 2021-08-31 DIAGNOSIS — G9341 Metabolic encephalopathy: Secondary | ICD-10-CM | POA: Diagnosis not present

## 2021-08-31 DIAGNOSIS — G459 Transient cerebral ischemic attack, unspecified: Secondary | ICD-10-CM | POA: Diagnosis not present

## 2021-08-31 DIAGNOSIS — F411 Generalized anxiety disorder: Secondary | ICD-10-CM | POA: Diagnosis not present

## 2021-08-31 HISTORY — DX: Other vitamin B12 deficiency anemias: D51.8

## 2021-08-31 HISTORY — DX: Insomnia, unspecified: G47.00

## 2021-08-31 HISTORY — DX: Allergic rhinitis, unspecified: J30.9

## 2021-09-01 DIAGNOSIS — F03B0 Unspecified dementia, moderate, without behavioral disturbance, psychotic disturbance, mood disturbance, and anxiety: Secondary | ICD-10-CM | POA: Diagnosis not present

## 2021-09-01 DIAGNOSIS — F3341 Major depressive disorder, recurrent, in partial remission: Secondary | ICD-10-CM | POA: Diagnosis not present

## 2021-09-01 DIAGNOSIS — K6289 Other specified diseases of anus and rectum: Secondary | ICD-10-CM | POA: Diagnosis not present

## 2021-09-01 DIAGNOSIS — G9341 Metabolic encephalopathy: Secondary | ICD-10-CM | POA: Diagnosis not present

## 2021-09-01 DIAGNOSIS — F411 Generalized anxiety disorder: Secondary | ICD-10-CM | POA: Diagnosis not present

## 2021-09-01 DIAGNOSIS — G459 Transient cerebral ischemic attack, unspecified: Secondary | ICD-10-CM | POA: Diagnosis not present

## 2021-09-01 DIAGNOSIS — I10 Essential (primary) hypertension: Secondary | ICD-10-CM | POA: Diagnosis not present

## 2021-09-02 DIAGNOSIS — F411 Generalized anxiety disorder: Secondary | ICD-10-CM | POA: Diagnosis not present

## 2021-09-02 DIAGNOSIS — R251 Tremor, unspecified: Secondary | ICD-10-CM | POA: Diagnosis not present

## 2021-09-02 DIAGNOSIS — I10 Essential (primary) hypertension: Secondary | ICD-10-CM | POA: Diagnosis not present

## 2021-09-02 DIAGNOSIS — F03B0 Unspecified dementia, moderate, without behavioral disturbance, psychotic disturbance, mood disturbance, and anxiety: Secondary | ICD-10-CM | POA: Diagnosis not present

## 2021-09-02 DIAGNOSIS — F32A Depression, unspecified: Secondary | ICD-10-CM | POA: Diagnosis not present

## 2021-09-02 DIAGNOSIS — G9341 Metabolic encephalopathy: Secondary | ICD-10-CM | POA: Diagnosis not present

## 2021-09-02 DIAGNOSIS — F4310 Post-traumatic stress disorder, unspecified: Secondary | ICD-10-CM | POA: Diagnosis not present

## 2021-09-02 DIAGNOSIS — F3341 Major depressive disorder, recurrent, in partial remission: Secondary | ICD-10-CM | POA: Diagnosis not present

## 2021-09-03 DIAGNOSIS — F32A Depression, unspecified: Secondary | ICD-10-CM | POA: Diagnosis not present

## 2021-09-03 DIAGNOSIS — G459 Transient cerebral ischemic attack, unspecified: Secondary | ICD-10-CM | POA: Diagnosis not present

## 2021-09-03 DIAGNOSIS — F03B0 Unspecified dementia, moderate, without behavioral disturbance, psychotic disturbance, mood disturbance, and anxiety: Secondary | ICD-10-CM | POA: Diagnosis not present

## 2021-09-03 DIAGNOSIS — F4310 Post-traumatic stress disorder, unspecified: Secondary | ICD-10-CM | POA: Diagnosis not present

## 2021-09-03 DIAGNOSIS — G9341 Metabolic encephalopathy: Secondary | ICD-10-CM | POA: Diagnosis not present

## 2021-09-03 DIAGNOSIS — F411 Generalized anxiety disorder: Secondary | ICD-10-CM | POA: Diagnosis not present

## 2021-09-03 DIAGNOSIS — I10 Essential (primary) hypertension: Secondary | ICD-10-CM | POA: Diagnosis not present

## 2021-09-04 DIAGNOSIS — F411 Generalized anxiety disorder: Secondary | ICD-10-CM | POA: Diagnosis not present

## 2021-09-04 DIAGNOSIS — F03B0 Unspecified dementia, moderate, without behavioral disturbance, psychotic disturbance, mood disturbance, and anxiety: Secondary | ICD-10-CM | POA: Diagnosis not present

## 2021-09-04 DIAGNOSIS — F4310 Post-traumatic stress disorder, unspecified: Secondary | ICD-10-CM | POA: Diagnosis not present

## 2021-09-04 DIAGNOSIS — F32A Depression, unspecified: Secondary | ICD-10-CM | POA: Diagnosis not present

## 2021-09-04 DIAGNOSIS — I10 Essential (primary) hypertension: Secondary | ICD-10-CM | POA: Diagnosis not present

## 2021-09-04 DIAGNOSIS — G9341 Metabolic encephalopathy: Secondary | ICD-10-CM | POA: Diagnosis not present

## 2021-09-09 DIAGNOSIS — F411 Generalized anxiety disorder: Secondary | ICD-10-CM | POA: Diagnosis not present

## 2021-09-09 DIAGNOSIS — F331 Major depressive disorder, recurrent, moderate: Secondary | ICD-10-CM | POA: Diagnosis not present

## 2021-09-09 DIAGNOSIS — Z9889 Other specified postprocedural states: Secondary | ICD-10-CM | POA: Diagnosis not present

## 2021-09-09 DIAGNOSIS — F29 Unspecified psychosis not due to a substance or known physiological condition: Secondary | ICD-10-CM | POA: Diagnosis not present

## 2021-09-09 DIAGNOSIS — F4310 Post-traumatic stress disorder, unspecified: Secondary | ICD-10-CM | POA: Diagnosis not present

## 2021-09-10 ENCOUNTER — Telehealth: Payer: Self-pay | Admitting: Internal Medicine

## 2021-09-10 ENCOUNTER — Telehealth: Payer: Self-pay | Admitting: Family Medicine

## 2021-09-10 NOTE — Telephone Encounter (Signed)
pt daughter called stating that the patient is back home with her due to having issues with the prior placement home. She is wanting to seek out a different home and is wanting to get in the office with doctor [name]. I told the daughter someone would call her about this as Dr. Tobie Poet doesn't have any openings at the moment. TY.  ?

## 2021-09-16 ENCOUNTER — Telehealth: Payer: Self-pay

## 2021-09-16 NOTE — Telephone Encounter (Signed)
error 

## 2021-09-17 DIAGNOSIS — E038 Other specified hypothyroidism: Secondary | ICD-10-CM | POA: Diagnosis not present

## 2021-09-17 DIAGNOSIS — E785 Hyperlipidemia, unspecified: Secondary | ICD-10-CM | POA: Diagnosis not present

## 2021-09-17 DIAGNOSIS — D518 Other vitamin B12 deficiency anemias: Secondary | ICD-10-CM | POA: Diagnosis not present

## 2021-09-17 DIAGNOSIS — E559 Vitamin D deficiency, unspecified: Secondary | ICD-10-CM | POA: Diagnosis not present

## 2021-09-17 DIAGNOSIS — E119 Type 2 diabetes mellitus without complications: Secondary | ICD-10-CM | POA: Diagnosis not present

## 2021-09-17 DIAGNOSIS — E039 Hypothyroidism, unspecified: Secondary | ICD-10-CM | POA: Diagnosis not present

## 2021-09-17 DIAGNOSIS — I10 Essential (primary) hypertension: Secondary | ICD-10-CM | POA: Diagnosis not present

## 2021-09-21 ENCOUNTER — Ambulatory Visit (INDEPENDENT_AMBULATORY_CARE_PROVIDER_SITE_OTHER): Payer: Medicare HMO | Admitting: Family Medicine

## 2021-09-21 VITALS — BP 108/64 | HR 78 | Temp 96.6°F | Resp 16 | Ht 61.0 in | Wt 138.6 lb

## 2021-09-21 DIAGNOSIS — F99 Mental disorder, not otherwise specified: Secondary | ICD-10-CM

## 2021-09-21 DIAGNOSIS — F5105 Insomnia due to other mental disorder: Secondary | ICD-10-CM | POA: Diagnosis not present

## 2021-09-21 DIAGNOSIS — F02818 Dementia in other diseases classified elsewhere, unspecified severity, with other behavioral disturbance: Secondary | ICD-10-CM

## 2021-09-21 DIAGNOSIS — G3183 Dementia with Lewy bodies: Secondary | ICD-10-CM | POA: Diagnosis not present

## 2021-09-21 DIAGNOSIS — K219 Gastro-esophageal reflux disease without esophagitis: Secondary | ICD-10-CM | POA: Diagnosis not present

## 2021-09-21 DIAGNOSIS — E559 Vitamin D deficiency, unspecified: Secondary | ICD-10-CM | POA: Diagnosis not present

## 2021-09-21 DIAGNOSIS — J309 Allergic rhinitis, unspecified: Secondary | ICD-10-CM

## 2021-09-21 DIAGNOSIS — E039 Hypothyroidism, unspecified: Secondary | ICD-10-CM

## 2021-09-21 DIAGNOSIS — F331 Major depressive disorder, recurrent, moderate: Secondary | ICD-10-CM | POA: Diagnosis not present

## 2021-09-21 DIAGNOSIS — E782 Mixed hyperlipidemia: Secondary | ICD-10-CM

## 2021-09-21 DIAGNOSIS — I10 Essential (primary) hypertension: Secondary | ICD-10-CM

## 2021-09-21 NOTE — Progress Notes (Signed)
? ? ?Subjective:  ?Patient ID: Michele Parker, female    DOB: 1941/04/18  Age: 81 y.o. MRN: [id number] ? ?Chief Complaint  ?Patient presents with  ? Dementia  ? ? ?HPI: ?History given by daughter, Michele Parker.  ?Michele Parker is a 81 yo female with lewy body dementia, depression, and hypothyroidism, who was a resident at [facility] and was complaining of hemorrhoids per the caretakers. So [facility] was using various preparation H suppositories. Called [facility] on March 30th, that the patient is unable to urinate. Recommend daughter take her to ED. Daughter take her to [facility] ED. Upon picking her up she was very agitated and repetative hand movements. [facility] ED [place] and I and O catheter and got 300 ml urine. Patient had scans of brain stable and abdomen normal. Patient is able to go on portable toilet and actually had a lot of urine and bm. ED reported they sent her home (back to [facility]) with a foley cath, but she did not have one. [facility] initially were not going to be able to take her back due to the "foley Cath." Friday, march 31 was ok. 4/1 [facility] called and again she one minute is complaining of being unable to urinate and then feels like she is going profusely. [facility] brought her home and tried to reach her psychiatrist. She then took her to [facility]. Admitted to regular floor. In ED, doctor's were concerned that she had a right facial droop. Ordered another ct of brain and mri of brain, as well as labs. Televisit with neurology. All scans showed no acute findings. Admitted overnight. Multiple I and O caths and finally place a foley. No UTI. Foley removed and she would again believe she had to go, but could not.  Sounds like per daughter they did an anoscopy which showed no hemorrhoids. Kept her until April 7th. Finally, said they had no reason to keep her. Months prior to her being admitted to [facility] she was taken off olanzapine. One day prior to discharge [facility] restarted olanzapine  5 mg before bed. Also prescribed her olanzepine 2.5 mg three times a day as needed for hallucinations or agitation. She went home with her daughter and she was being given one extra olanzapine daily. Has not needed breakthrough olanzepine for the last 10 days. Patient has stopped her agitation concerning her urinary and bowel function. Not going back to [facility]. Going to [facility]. They have assisted living and memory care. She will be starting in assisted living. They here to have an FL2 filled out. Patient was discharged on tamsulosin and recommended to follow up with urology. Neither are likely needed due to response to olanzapine.  ?Patient was discharged on lipitor 80 mg daily and aspirin 81 mg daily. Her cholesterol was elevated in hospital. Daughter requesting to decrease lipitor to 40 mg daily. This is reasonable. She is also on trazodone which has helped her sleep. She has not needed more than 50 mg before bed, although was written for up to 3 oral before bed.  ? ? ?Current Outpatient Medications on File Prior to Visit  ?Medication Sig Dispense Refill  ? aspirin EC 81 MG tablet Take 81 mg by mouth daily. Swallow whole.    ? atorvastatin (LIPITOR) 80 MG tablet Take 80 mg by mouth daily.    ? benazepril-hydrochlorthiazide (LOTENSIN HCT) 20-12.5 MG tablet Take 1 tablet by mouth 2 (two) times daily. 60 tablet 0  ? chlorpheniramine (CHLOR-TRIMETON) 4 MG tablet  4 mg at bedtime.    ? Cholecalciferol (VITAMIN D3) 25 MCG (1000 UT) CAPS Take 1,000 Units by mouth.     ? ezetimibe (ZETIA) 10 MG tablet Take 10 mg by mouth daily.    ? famotidine (PEPCID) 20 MG tablet Take 20 mg by mouth at bedtime.    ? FLUoxetine (PROZAC) 20 MG tablet Take 20 mg by mouth daily.    ? hydrALAZINE (APRESOLINE) 10 MG tablet Take 10 mg by mouth 2 (two) times daily.    ? OLANZapine (ZYPREXA) 2.5 MG tablet Take 2.5 mg by mouth 3 (three) times daily as needed.    ? OLANZapine (ZYPREXA) 5 MG tablet 5 mg.    ? polyethylene glycol (MIRALAX /  GLYCOLAX) 17 g packet Take 17 g by mouth daily as needed.    ? potassium chloride (MICRO-K) 10 MEQ CR capsule TAKE 1 CAPSULE EVERY DAY 90 capsule 1  ? SYNTHROID 50 MCG tablet TAKE 1 TABLET DAILY BEFORE BREAKFAST 90 tablet 3  ? tamsulosin (FLOMAX) 0.4 MG CAPS capsule Take 0.4 mg by mouth daily.    ? traZODone (DESYREL) 50 MG tablet Take by mouth. 1 1/2 to 2 tablets at bedtime    ? ?No current facility-administered medications on file prior to visit.  ? ?Past Medical History:  ?Diagnosis Date  ? Angioedema   ? Bradycardia   ? Cervicalgia   ? Depression   ? Essential hypertension   ? Hemochromatosis   ? Hemorrhoid   ? Hereditary hemochromatosis ([place])   ? HTN (hypertension)   ? Hypercalcemia   ? Hyperlipidemia   ? Hypothyroid   ? Memory loss   ? Osteopenia   ? Osteopenia   ? Other amnesia   ? Other constipation   ? Repeated falls   ? Tremor   ? Vitamin D deficiency   ? ?Past Surgical History:  ?Procedure Laterality Date  ? CHOLECYSTECTOMY    ? KNEE SURGERY    ? MINOR HEMORRHOIDECTOMY    ? SHOULDER SURGERY    ? TUBAL LIGATION    ?  ?Family History  ?Problem Relation Age of Onset  ? Heart attack Mother   ? Heart disease Mother   ? CAD Brother   ? Alcohol abuse Brother   ? Other Father   ?     unsure of medical hx - died in MVA when patient was 81 years old  ? ?Social History  ? ?Socioeconomic History  ? Marital status: Married  ?  Spouse name: Not on file  ? Number of children: 2  ? Years of education: 24  ? Highest education level: High school graduate  ?Occupational History  ? Occupation: Retired-hosiery  ?Tobacco Use  ? Smoking status: Never  ? Smokeless tobacco: Never  ?Vaping Use  ? Vaping Use: Never used  ?Substance and Sexual Activity  ? Alcohol use: No  ? Drug use: No  ? Sexual activity: Not on file  ?Other Topics Concern  ? Not on file  ?Social History Narrative  ? Lives at home with her husband.  ? Left-handed.  ? 2 cups caffeine per day.  ? ?Social Determinants of Health  ? ?Financial Resource Strain: Low Risk    ? Difficulty of Paying Living Expenses: Not hard at all  ?Food Insecurity: Not on file  ?Transportation Needs: Not on file  ?Physical Activity: Not on file  ?Stress: Not on file  ?Social Connections: Not on file  ? ? ?Review of Systems  ?Constitutional:  Positive for fatigue. Negative for chills and fever.  ?HENT:  Negative for congestion, rhinorrhea and sore throat.   ?Respiratory:  Negative for cough and shortness of breath.   ?Cardiovascular:  Negative for chest pain.  ?Gastrointestinal:  Negative for abdominal pain, constipation, diarrhea, nausea and vomiting.  ?Genitourinary:  Negative for dysuria and urgency.  ?Musculoskeletal:  Negative for back pain and myalgias.  ?Neurological:  Negative for dizziness, weakness, light-headedness and headaches.  ?     Balance issues.    ?Psychiatric/Behavioral:  Negative for dysphoric mood. The patient is not nervous/anxious.   ? ? ?Objective:  ?BP 108/64   Pulse 78   Temp (!) 96.6 ?F (35.9 ?C)   Resp 16   Ht '5\' 1"'$  (1.549 m)   Wt 138 lb 9.6 oz (62.9 kg)   BMI 26.19 kg/m?  ? ? ?  09/21/2021  ?  3:19 PM 08/27/2021  ?  7:48 PM 08/27/2021  ?  7:00 PM  ?BP/Weight  ?Systolic BP 334 356 861  ?Diastolic BP 64 77 77  ?Wt. (Lbs) 138.6    ?BMI 26.19 kg/m2    ? ? ?Physical Exam ?Vitals reviewed.  ?Constitutional:   ?   Appearance: Normal appearance.  ?Cardiovascular:  ?   Rate and Rhythm: Normal rate and regular rhythm.  ?Pulmonary:  ?   Effort: Pulmonary effort is normal.  ?   Breath sounds: Normal breath sounds.  ?Abdominal:  ?   Palpations: Abdomen is soft.  ?   Tenderness: There is no abdominal tenderness.  ?Neurological:  ?   Mental Status: She is alert.  ?Psychiatric:  ?   Comments: Flat affect. No agitation today.  ? ? ?Diabetic Foot Exam - Simple   ?No data filed ?  ?  ? ?Lab Results  ?Component Value Date  ? WBC 7.9 08/27/2021  ? HGB 13.2 08/27/2021  ? HCT 37.0 08/27/2021  ? PLT 234 08/27/2021  ? GLUCOSE 113 (H) 08/27/2021  ? CHOL 245 (H) 11/25/2020  ? TRIG 242 (H)  11/25/2020  ? HDL 60 11/25/2020  ? LDLCALC 142 (H) 11/25/2020  ? ALT 21 08/27/2021  ? AST 27 08/27/2021  ? NA 132 (L) 08/27/2021  ? K 3.9 08/27/2021  ? CL 97 (L) 08/27/2021  ? CREATININE 0.90 08/27/2021  ? BUN 17

## 2021-09-22 NOTE — Telephone Encounter (Signed)
Error

## 2021-09-24 DIAGNOSIS — I2510 Atherosclerotic heart disease of native coronary artery without angina pectoris: Secondary | ICD-10-CM | POA: Diagnosis not present

## 2021-09-24 DIAGNOSIS — E039 Hypothyroidism, unspecified: Secondary | ICD-10-CM | POA: Diagnosis not present

## 2021-09-24 DIAGNOSIS — I10 Essential (primary) hypertension: Secondary | ICD-10-CM | POA: Diagnosis not present

## 2021-09-24 DIAGNOSIS — G3183 Dementia with Lewy bodies: Secondary | ICD-10-CM | POA: Diagnosis not present

## 2021-09-24 DIAGNOSIS — K219 Gastro-esophageal reflux disease without esophagitis: Secondary | ICD-10-CM | POA: Diagnosis not present

## 2021-09-24 DIAGNOSIS — F331 Major depressive disorder, recurrent, moderate: Secondary | ICD-10-CM | POA: Diagnosis not present

## 2021-09-24 DIAGNOSIS — E782 Mixed hyperlipidemia: Secondary | ICD-10-CM | POA: Diagnosis not present

## 2021-09-24 DIAGNOSIS — E876 Hypokalemia: Secondary | ICD-10-CM | POA: Diagnosis not present

## 2021-09-24 DIAGNOSIS — E559 Vitamin D deficiency, unspecified: Secondary | ICD-10-CM | POA: Diagnosis not present

## 2021-09-27 ENCOUNTER — Encounter: Payer: Self-pay | Admitting: Family Medicine

## 2021-09-27 NOTE — Assessment & Plan Note (Signed)
Pepcid AC one oral before bed  ?

## 2021-09-27 NOTE — Assessment & Plan Note (Signed)
Management per specialist.  ?Continue prozac 20 mg before bed.  ?

## 2021-09-27 NOTE — Assessment & Plan Note (Signed)
ON chlorpheneramine 4 mg before bed and this has resolved her cough.  ?

## 2021-09-27 NOTE — Assessment & Plan Note (Signed)
Continue trazodone 50 mg before bed.  ?

## 2021-09-27 NOTE — Assessment & Plan Note (Signed)
Continue vitamin D 1000 IU daily.  

## 2021-09-27 NOTE — Assessment & Plan Note (Signed)
Continue prozac.  ?Continue trazodone for insomnia.  ?Continue Olanzapine 5 mg before bed.  ?Continue olanzapine 2.5 mg three times a day as needed agitation or hallucinations.  ?

## 2021-09-27 NOTE — Assessment & Plan Note (Signed)
Recommend adjust lipitor to 80 mg 1/2 before bed.  ?Continue aspirin 81 mg daily.  ? ?

## 2021-09-27 NOTE — Assessment & Plan Note (Signed)
Continue levothryoxine 50 mcg one in am.  ?

## 2021-09-27 NOTE — Assessment & Plan Note (Addendum)
Well controlled.  ?No changes to medicines. Continue benazapril/hctz 20/12.5 mg I oral twice daily and hydralazine 10 mg twice daily.  ?Continue to work on eating a healthy diet and exercise.  ? ?

## 2021-10-07 DIAGNOSIS — F4310 Post-traumatic stress disorder, unspecified: Secondary | ICD-10-CM | POA: Diagnosis not present

## 2021-10-07 DIAGNOSIS — F411 Generalized anxiety disorder: Secondary | ICD-10-CM | POA: Diagnosis not present

## 2021-10-07 DIAGNOSIS — Z9889 Other specified postprocedural states: Secondary | ICD-10-CM | POA: Diagnosis not present

## 2021-10-07 DIAGNOSIS — F29 Unspecified psychosis not due to a substance or known physiological condition: Secondary | ICD-10-CM | POA: Diagnosis not present

## 2021-10-07 DIAGNOSIS — F331 Major depressive disorder, recurrent, moderate: Secondary | ICD-10-CM | POA: Diagnosis not present

## 2021-10-13 DIAGNOSIS — N390 Urinary tract infection, site not specified: Secondary | ICD-10-CM | POA: Diagnosis not present

## 2021-10-15 DIAGNOSIS — E559 Vitamin D deficiency, unspecified: Secondary | ICD-10-CM | POA: Diagnosis not present

## 2021-10-15 DIAGNOSIS — E7849 Other hyperlipidemia: Secondary | ICD-10-CM | POA: Diagnosis not present

## 2021-10-15 DIAGNOSIS — E119 Type 2 diabetes mellitus without complications: Secondary | ICD-10-CM | POA: Diagnosis not present

## 2021-10-15 DIAGNOSIS — D518 Other vitamin B12 deficiency anemias: Secondary | ICD-10-CM | POA: Diagnosis not present

## 2021-10-15 DIAGNOSIS — Z79899 Other long term (current) drug therapy: Secondary | ICD-10-CM | POA: Diagnosis not present

## 2021-10-15 DIAGNOSIS — E038 Other specified hypothyroidism: Secondary | ICD-10-CM | POA: Diagnosis not present

## 2021-10-21 DIAGNOSIS — D518 Other vitamin B12 deficiency anemias: Secondary | ICD-10-CM | POA: Diagnosis not present

## 2021-10-21 DIAGNOSIS — I10 Essential (primary) hypertension: Secondary | ICD-10-CM | POA: Diagnosis not present

## 2021-10-21 DIAGNOSIS — E039 Hypothyroidism, unspecified: Secondary | ICD-10-CM | POA: Diagnosis not present

## 2021-10-21 DIAGNOSIS — E119 Type 2 diabetes mellitus without complications: Secondary | ICD-10-CM | POA: Diagnosis not present

## 2021-10-21 DIAGNOSIS — E559 Vitamin D deficiency, unspecified: Secondary | ICD-10-CM | POA: Diagnosis not present

## 2021-10-21 DIAGNOSIS — E785 Hyperlipidemia, unspecified: Secondary | ICD-10-CM | POA: Diagnosis not present

## 2021-10-21 DIAGNOSIS — E038 Other specified hypothyroidism: Secondary | ICD-10-CM | POA: Diagnosis not present

## 2021-11-25 DIAGNOSIS — E119 Type 2 diabetes mellitus without complications: Secondary | ICD-10-CM | POA: Diagnosis not present

## 2021-11-25 DIAGNOSIS — D518 Other vitamin B12 deficiency anemias: Secondary | ICD-10-CM | POA: Diagnosis not present

## 2021-11-25 DIAGNOSIS — I10 Essential (primary) hypertension: Secondary | ICD-10-CM | POA: Diagnosis not present

## 2021-11-25 DIAGNOSIS — E039 Hypothyroidism, unspecified: Secondary | ICD-10-CM | POA: Diagnosis not present

## 2021-11-25 DIAGNOSIS — E785 Hyperlipidemia, unspecified: Secondary | ICD-10-CM | POA: Diagnosis not present

## 2021-11-25 DIAGNOSIS — E038 Other specified hypothyroidism: Secondary | ICD-10-CM | POA: Diagnosis not present

## 2021-11-25 DIAGNOSIS — E559 Vitamin D deficiency, unspecified: Secondary | ICD-10-CM | POA: Diagnosis not present

## 2021-12-02 DIAGNOSIS — R0989 Other specified symptoms and signs involving the circulatory and respiratory systems: Secondary | ICD-10-CM | POA: Diagnosis not present

## 2021-12-08 DIAGNOSIS — M8580 Other specified disorders of bone density and structure, unspecified site: Secondary | ICD-10-CM | POA: Diagnosis not present

## 2021-12-08 DIAGNOSIS — F0390 Unspecified dementia without behavioral disturbance: Secondary | ICD-10-CM | POA: Diagnosis not present

## 2021-12-08 DIAGNOSIS — I10 Essential (primary) hypertension: Secondary | ICD-10-CM | POA: Diagnosis not present

## 2021-12-08 DIAGNOSIS — F32A Depression, unspecified: Secondary | ICD-10-CM | POA: Diagnosis not present

## 2021-12-08 DIAGNOSIS — N390 Urinary tract infection, site not specified: Secondary | ICD-10-CM | POA: Diagnosis not present

## 2021-12-24 DIAGNOSIS — E038 Other specified hypothyroidism: Secondary | ICD-10-CM | POA: Diagnosis not present

## 2021-12-24 DIAGNOSIS — E785 Hyperlipidemia, unspecified: Secondary | ICD-10-CM | POA: Diagnosis not present

## 2021-12-24 DIAGNOSIS — E559 Vitamin D deficiency, unspecified: Secondary | ICD-10-CM | POA: Diagnosis not present

## 2021-12-24 DIAGNOSIS — D518 Other vitamin B12 deficiency anemias: Secondary | ICD-10-CM | POA: Diagnosis not present

## 2021-12-24 DIAGNOSIS — I10 Essential (primary) hypertension: Secondary | ICD-10-CM | POA: Diagnosis not present

## 2021-12-24 DIAGNOSIS — E039 Hypothyroidism, unspecified: Secondary | ICD-10-CM | POA: Diagnosis not present

## 2021-12-24 DIAGNOSIS — E119 Type 2 diabetes mellitus without complications: Secondary | ICD-10-CM | POA: Diagnosis not present

## 2022-01-13 DIAGNOSIS — F29 Unspecified psychosis not due to a substance or known physiological condition: Secondary | ICD-10-CM | POA: Diagnosis not present

## 2022-01-13 DIAGNOSIS — F5101 Primary insomnia: Secondary | ICD-10-CM | POA: Diagnosis not present

## 2022-01-13 DIAGNOSIS — F02B18 Dementia in other diseases classified elsewhere, moderate, with other behavioral disturbance: Secondary | ICD-10-CM | POA: Diagnosis not present

## 2022-01-13 DIAGNOSIS — F331 Major depressive disorder, recurrent, moderate: Secondary | ICD-10-CM | POA: Diagnosis not present

## 2022-01-13 DIAGNOSIS — F419 Anxiety disorder, unspecified: Secondary | ICD-10-CM | POA: Diagnosis not present

## 2022-01-30 DIAGNOSIS — N390 Urinary tract infection, site not specified: Secondary | ICD-10-CM | POA: Diagnosis not present

## 2022-02-09 DIAGNOSIS — E782 Mixed hyperlipidemia: Secondary | ICD-10-CM | POA: Diagnosis not present

## 2022-02-09 DIAGNOSIS — E119 Type 2 diabetes mellitus without complications: Secondary | ICD-10-CM | POA: Diagnosis not present

## 2022-02-09 DIAGNOSIS — Z79899 Other long term (current) drug therapy: Secondary | ICD-10-CM | POA: Diagnosis not present

## 2022-02-09 DIAGNOSIS — D518 Other vitamin B12 deficiency anemias: Secondary | ICD-10-CM | POA: Diagnosis not present

## 2022-02-25 DIAGNOSIS — Z9889 Other specified postprocedural states: Secondary | ICD-10-CM | POA: Diagnosis not present

## 2022-02-25 DIAGNOSIS — F03B0 Unspecified dementia, moderate, without behavioral disturbance, psychotic disturbance, mood disturbance, and anxiety: Secondary | ICD-10-CM | POA: Diagnosis not present

## 2022-02-25 DIAGNOSIS — F3341 Major depressive disorder, recurrent, in partial remission: Secondary | ICD-10-CM | POA: Diagnosis not present

## 2022-02-25 DIAGNOSIS — F4310 Post-traumatic stress disorder, unspecified: Secondary | ICD-10-CM | POA: Diagnosis not present

## 2022-02-25 DIAGNOSIS — F411 Generalized anxiety disorder: Secondary | ICD-10-CM | POA: Diagnosis not present

## 2022-05-09 DIAGNOSIS — R296 Repeated falls: Secondary | ICD-10-CM | POA: Diagnosis not present

## 2022-05-09 DIAGNOSIS — R410 Disorientation, unspecified: Secondary | ICD-10-CM | POA: Diagnosis not present

## 2022-05-09 DIAGNOSIS — R112 Nausea with vomiting, unspecified: Secondary | ICD-10-CM | POA: Diagnosis not present

## 2022-05-09 DIAGNOSIS — R1110 Vomiting, unspecified: Secondary | ICD-10-CM | POA: Diagnosis not present

## 2022-05-09 DIAGNOSIS — N390 Urinary tract infection, site not specified: Secondary | ICD-10-CM | POA: Diagnosis not present

## 2022-05-09 DIAGNOSIS — R9431 Abnormal electrocardiogram [ECG] [EKG]: Secondary | ICD-10-CM | POA: Diagnosis not present

## 2022-05-09 DIAGNOSIS — B3731 Acute candidiasis of vulva and vagina: Secondary | ICD-10-CM | POA: Diagnosis not present

## 2022-05-09 DIAGNOSIS — Z8659 Personal history of other mental and behavioral disorders: Secondary | ICD-10-CM | POA: Diagnosis not present

## 2022-05-09 DIAGNOSIS — F0390 Unspecified dementia without behavioral disturbance: Secondary | ICD-10-CM | POA: Diagnosis not present

## 2022-05-09 DIAGNOSIS — Z20822 Contact with and (suspected) exposure to covid-19: Secondary | ICD-10-CM | POA: Diagnosis not present

## 2022-05-09 DIAGNOSIS — W19XXXA Unspecified fall, initial encounter: Secondary | ICD-10-CM | POA: Diagnosis not present

## 2022-05-10 DIAGNOSIS — R531 Weakness: Secondary | ICD-10-CM | POA: Diagnosis not present

## 2022-05-10 DIAGNOSIS — Z7401 Bed confinement status: Secondary | ICD-10-CM | POA: Diagnosis not present

## 2022-05-20 DIAGNOSIS — F331 Major depressive disorder, recurrent, moderate: Secondary | ICD-10-CM | POA: Diagnosis not present

## 2022-05-20 DIAGNOSIS — F02B18 Dementia in other diseases classified elsewhere, moderate, with other behavioral disturbance: Secondary | ICD-10-CM | POA: Diagnosis not present

## 2022-05-20 DIAGNOSIS — F5101 Primary insomnia: Secondary | ICD-10-CM | POA: Diagnosis not present

## 2022-05-20 DIAGNOSIS — G3183 Dementia with Lewy bodies: Secondary | ICD-10-CM | POA: Diagnosis not present

## 2022-06-02 DIAGNOSIS — D518 Other vitamin B12 deficiency anemias: Secondary | ICD-10-CM | POA: Diagnosis not present

## 2022-06-02 DIAGNOSIS — E119 Type 2 diabetes mellitus without complications: Secondary | ICD-10-CM | POA: Diagnosis not present

## 2022-06-02 DIAGNOSIS — Z79899 Other long term (current) drug therapy: Secondary | ICD-10-CM | POA: Diagnosis not present

## 2022-06-02 DIAGNOSIS — E782 Mixed hyperlipidemia: Secondary | ICD-10-CM | POA: Diagnosis not present

## 2022-06-03 ENCOUNTER — Encounter: Payer: Self-pay | Admitting: *Deleted

## 2022-06-03 ENCOUNTER — Ambulatory Visit: Payer: Medicare HMO | Admitting: Cardiology

## 2022-06-03 ENCOUNTER — Encounter: Payer: Self-pay | Admitting: Cardiology

## 2022-06-08 DIAGNOSIS — R7989 Other specified abnormal findings of blood chemistry: Secondary | ICD-10-CM | POA: Diagnosis not present

## 2022-06-08 DIAGNOSIS — Z79899 Other long term (current) drug therapy: Secondary | ICD-10-CM | POA: Diagnosis not present

## 2022-06-10 DIAGNOSIS — R600 Localized edema: Secondary | ICD-10-CM | POA: Diagnosis not present

## 2022-06-10 DIAGNOSIS — Z5181 Encounter for therapeutic drug level monitoring: Secondary | ICD-10-CM | POA: Diagnosis not present

## 2022-06-10 DIAGNOSIS — F0390 Unspecified dementia without behavioral disturbance: Secondary | ICD-10-CM | POA: Diagnosis not present

## 2022-06-17 DIAGNOSIS — G3183 Dementia with Lewy bodies: Secondary | ICD-10-CM | POA: Diagnosis not present

## 2022-06-17 DIAGNOSIS — F0390 Unspecified dementia without behavioral disturbance: Secondary | ICD-10-CM | POA: Diagnosis not present

## 2022-06-17 DIAGNOSIS — R5381 Other malaise: Secondary | ICD-10-CM | POA: Diagnosis not present

## 2022-06-17 DIAGNOSIS — F331 Major depressive disorder, recurrent, moderate: Secondary | ICD-10-CM | POA: Diagnosis not present

## 2022-06-17 DIAGNOSIS — Z87440 Personal history of urinary (tract) infections: Secondary | ICD-10-CM | POA: Diagnosis not present

## 2022-06-17 DIAGNOSIS — R4182 Altered mental status, unspecified: Secondary | ICD-10-CM | POA: Diagnosis not present

## 2022-06-17 DIAGNOSIS — F5101 Primary insomnia: Secondary | ICD-10-CM | POA: Diagnosis not present

## 2022-06-17 DIAGNOSIS — F02B18 Dementia in other diseases classified elsewhere, moderate, with other behavioral disturbance: Secondary | ICD-10-CM | POA: Diagnosis not present

## 2022-06-22 DIAGNOSIS — N390 Urinary tract infection, site not specified: Secondary | ICD-10-CM | POA: Diagnosis not present

## 2022-06-22 DIAGNOSIS — J439 Emphysema, unspecified: Secondary | ICD-10-CM | POA: Diagnosis not present

## 2022-06-22 DIAGNOSIS — J9809 Other diseases of bronchus, not elsewhere classified: Secondary | ICD-10-CM | POA: Diagnosis not present

## 2022-06-24 ENCOUNTER — Encounter: Payer: Self-pay | Admitting: Cardiology

## 2022-06-24 ENCOUNTER — Ambulatory Visit: Payer: Medicare HMO | Attending: Cardiology | Admitting: Cardiology

## 2022-06-24 VITALS — BP 124/78 | HR 65 | Ht 64.0 in | Wt 161.4 lb

## 2022-06-24 DIAGNOSIS — F02818 Dementia in other diseases classified elsewhere, unspecified severity, with other behavioral disturbance: Secondary | ICD-10-CM | POA: Diagnosis not present

## 2022-06-24 DIAGNOSIS — I493 Ventricular premature depolarization: Secondary | ICD-10-CM | POA: Diagnosis not present

## 2022-06-24 DIAGNOSIS — M7989 Other specified soft tissue disorders: Secondary | ICD-10-CM | POA: Diagnosis not present

## 2022-06-24 DIAGNOSIS — E785 Hyperlipidemia, unspecified: Secondary | ICD-10-CM

## 2022-06-24 DIAGNOSIS — I10 Essential (primary) hypertension: Secondary | ICD-10-CM

## 2022-06-24 DIAGNOSIS — G3183 Dementia with Lewy bodies: Secondary | ICD-10-CM

## 2022-06-24 DIAGNOSIS — R0609 Other forms of dyspnea: Secondary | ICD-10-CM

## 2022-06-24 MED ORDER — HYDROCHLOROTHIAZIDE 12.5 MG PO CAPS: 12.5000 mg | ORAL_CAPSULE | Freq: Every day | ORAL | 3 refills | Status: DC

## 2022-06-24 MED ORDER — LOSARTAN POTASSIUM 25 MG PO TABS: 25.0000 mg | ORAL_TABLET | Freq: Every day | ORAL | 3 refills | Status: DC

## 2022-06-24 NOTE — Patient Instructions (Signed)
Medication Instructions:   STOP: Benazepril/HCTZ  START: Losartan '25mg'$  1 daily  START: Hydrochlorothiazide 12.'5mg'$  1 daily   Lab Work: BMP- today If you have labs (blood work) drawn today and your tests are completely normal, you will receive your results only by: [place] (if you have MyChart) OR A paper copy in the mail If you have any lab test that is abnormal or we need to change your treatment, we will call you to review the results.   Testing/Procedures: Your physician has requested that you have an echocardiogram. Echocardiography is a painless test that uses sound waves to create images of your heart. It provides your doctor with information about the size and shape of your heart and how well your heart's chambers and valves are working. This procedure takes approximately one hour. There are no restrictions for this procedure. Please do NOT wear cologne, perfume, aftershave, or lotions (deodorant is allowed). Please arrive 15 minutes prior to your appointment time.    Follow-Up: At [facility], you and your health needs are our priority.  As part of our continuing mission to provide you with exceptional heart care, we have created designated Provider Care Teams.  These Care Teams include your primary Cardiologist (physician) and Advanced Practice Providers (APPs -  Physician Assistants and Nurse Practitioners) who all work together to provide you with the care you need, when you need it.  We recommend signing up for the patient portal called "MyChart".  Sign up information is provided on this After Visit Summary.  MyChart is used to connect with patients for Virtual Visits (Telemedicine).  Patients are able to view lab/test results, encounter notes, upcoming appointments, etc.  Non-urgent messages can be sent to your provider as well.   To learn more about what you can do with MyChart, go to [email or web address].    Your next appointment:   3 month(s)  The format  for your next appointment:   In Person  Provider:   Jenne Campus, MD    Other Instructions NA

## 2022-06-24 NOTE — Addendum Note (Signed)
Addended by: Jacobo Forest D on: 06/24/2022 02:49 PM   Modules accepted: Orders

## 2022-06-24 NOTE — Progress Notes (Signed)
Cardiology Consultation:    Date:  06/24/2022   ID:  Damilola, Flamm 03-31-1941, MRN [id number]  PCP:  Townsend Roger, MD  Cardiologist:  Jenne Campus, MD   Referring MD: Nona Dell, Corene Cornea, MD   Chief Complaint  Patient presents with   Establish Care    History of Present Illness:    Michele Parker is a 82 y.o. female who is being seen today for the evaluation of swollen legs at the request of Townsend Roger, MD. past medical history significant for essential hypertension, depression, dementia, she was referred to [place] because of swelling of the legs.  She comes today with her daughter who is wife of one of my patient.  Patient said that she is doing fine.  She walks around with a walker.  She is staying at facility.  Denies have any chest pain tightness squeezing pressure burning chest.  Swelling of lower extremities now is minimal however her daughter showed me a picture of her legs being quite significantly swollen clearly pitting edema.  Patient tells me that she snores at night.  Denies having any paroxysmal nocturnal dyspnea.  Denies have any chest pain tightness squeezing pressure burning chest no palpitations.  Past Medical History:  Diagnosis Date   Allergic rhinitis 08/31/2021   Allergies 02/08/2020   Angioedema    Arthritis 06/09/2016   Bradycardia    Cervicalgia    Depression    Depression, major, recurrent, moderate ([place]) 05/11/2021   Essential hypertension    Gait abnormality 09/27/2017   GERD (gastroesophageal reflux disease) 05/11/2021   Hemochromatosis    Hemorrhoid    Hereditary hemochromatosis ([place])    HTN (hypertension)    Hypercalcemia    Hyperlipidemia    Hypothyroid    Hypothyroidism 05/11/2021   Insomnia 08/31/2021   Lewy body dementia with behavioral disturbance ([place]) 05/11/2021   MDD (major depressive disorder), recurrent, in partial remission ([place]) 09/27/2013   PHQ- 1 (July, 2016); PHQ-9 done - 06/19/15   Memory loss    Osteopenia     Osteopenia    Other amnesia    Other constipation    Other vitamin B12 deficiency anemias 08/31/2021   Palpitations 07/20/2013   Primary hemochromatosis ([place]) 07/30/2019   PVC's (premature ventricular contractions) 06/15/2021   Repeated falls    Tremor    Vitamin D deficiency     Past Surgical History:  Procedure Laterality Date   CHOLECYSTECTOMY     KNEE SURGERY     MINOR HEMORRHOIDECTOMY     SHOULDER SURGERY     Fracture   TUBAL LIGATION      Current Medications: Current Meds  Medication Sig   acetaminophen (TYLENOL) 325 MG tablet Take 650 mg by mouth every 6 (six) hours as needed for mild pain or moderate pain.   Acetaminophen 160 MG TBDP Take by mouth.   aspirin EC 81 MG tablet Take 81 mg by mouth daily. Swallow whole.   atorvastatin (LIPITOR) 40 MG tablet Take 40 mg by mouth daily.   azithromycin (ZITHROMAX) 250 MG tablet Take 250 mg by mouth See admin instructions. '500mg'$  on the first day then '250mg'$  daily   benazepril-hydrochlorthiazide (LOTENSIN HCT) 20-12.5 MG tablet Take 1 tablet by mouth 2 (two) times daily.   benzonatate (TESSALON) 200 MG capsule Take 200 mg by mouth 3 (three) times daily as needed for cough.   cetirizine (ZYRTEC) 5 MG tablet Take 5 mg by mouth daily.   Cholecalciferol (VITAMIN D3) 25 MCG (  1000 UT) CAPS Take 1,000 Units by mouth daily.   famotidine (PEPCID) 20 MG tablet Take 20 mg by mouth at bedtime.   FLUoxetine (PROZAC) 20 MG tablet Take 20 mg by mouth daily.   hydrALAZINE (APRESOLINE) 10 MG tablet Take 10 mg by mouth 2 (two) times daily.   levothyroxine (SYNTHROID) 50 MCG tablet Take 50 mcg by mouth daily before breakfast.   nystatin powder Apply 1 Application topically 2 (two) times daily.   OLANZapine (ZYPREXA) 5 MG tablet Take 5 mg by mouth at bedtime.   ondansetron (ZOFRAN) 4 MG tablet Take 4 mg by mouth every 6 (six) hours as needed for nausea or vomiting.   potassium chloride (MICRO-K) 10 MEQ CR capsule TAKE 1 CAPSULE EVERY DAY (Patient  taking differently: Take 10 mEq by mouth daily.)   traZODone (DESYREL) 50 MG tablet Take 50 mg by mouth at bedtime. 1 1/2 to 2 tablets at bedtime   [DISCONTINUED] acetaminophen (TYLENOL) 325 MG tablet Take 650 mg by mouth every 6 (six) hours as needed for mild pain or moderate pain.   [DISCONTINUED] benzonatate (TESSALON) 200 MG capsule Take 200 mg by mouth 3 (three) times daily as needed for cough.   [DISCONTINUED] chlorpheniramine (CHLOR-TRIMETON) 4 MG tablet Take 4 mg by mouth at bedtime.   [DISCONTINUED] polyethylene glycol (MIRALAX / GLYCOLAX) 17 g packet Take 17 g by mouth daily as needed for mild constipation or moderate constipation.     Allergies:   Bystolic [nebivolol hcl], Aricept [donepezil], Lisinopril, Other, Oxycodone, and Propoxyphene   Social History   Socioeconomic History   Marital status: Married    Spouse name: Not on file   Number of children: 2   Years of education: 12   Highest education level: High school graduate  Occupational History   Occupation: Retired-hosiery  Tobacco Use   Smoking status: Never   Smokeless tobacco: Never  Vaping Use   Vaping Use: Never used  Substance and Sexual Activity   Alcohol use: No   Drug use: No   Sexual activity: Not on file  Other Topics Concern   Not on file  Social History Narrative   Lives at home with her husband.   Left-handed.   2 cups caffeine per day.   Social Determinants of Health   Financial Resource Strain: Low Risk  (05/04/2021)   Overall Financial Resource Strain (CARDIA)    Difficulty of Paying Living Expenses: Not hard at all  Food Insecurity: No Food Insecurity (05/07/2020)   Hunger Vital Sign    Worried About Running Out of Food in the Last Year: Never true    Ran Out of Food in the Last Year: Never true  Transportation Needs: No Transportation Needs (05/07/2020)   PRAPARE - Hydrologist (Medical): No    Lack of Transportation (Non-Medical): No  Physical Activity: Not  on file  Stress: Not on file  Social Connections: Not on file     Family History: The patient's family history includes Alcohol abuse in her brother; CAD in her brother; Depression in her mother; Heart attack in her mother; Heart disease in her mother; Hypertension in her brother; Other in her father; Stroke in her mother. ROS:   Please see the history of present illness.    All 14 point review of systems negative except as described per history of present illness.  EKGs/Labs/Other Studies Reviewed:    The following studies were reviewed today:   EKG:  EKG is  ordered today.  The ekg ordered today demonstrates normal sinus rhythm, normal P interval, cannot rule out inferior wall MI, nonspecific ST segment changes  Recent Labs: 08/27/2021: ALT 21; BUN 17; Creatinine, Ser 0.90; Hemoglobin 13.2; Platelets 234; Potassium 3.9; Sodium 132  Recent Lipid Panel    Component Value Date/Time   CHOL 245 (H) 11/25/2020 1215   TRIG 242 (H) 11/25/2020 1215   HDL 60 11/25/2020 1215   CHOLHDL 4.1 11/25/2020 1215   LDLCALC 142 (H) 11/25/2020 1215    Physical Exam:    VS:  BP 124/78 (BP Location: Left Arm, Patient Position: Sitting)   Pulse 65   Ht '5\' 4"'$  (1.626 m)   Wt 161 lb 6.4 oz (73.2 kg)   SpO2 94%   BMI 27.70 kg/m     Wt Readings from Last 3 Encounters:  06/24/22 161 lb 6.4 oz (73.2 kg)  09/21/21 138 lb 9.6 oz (62.9 kg)  08/27/21 145 lb (65.8 kg)     GEN:  Well nourished, well developed in no acute distress HEENT: Normal NECK: No JVD; No carotid bruits LYMPHATICS: No lymphadenopathy CARDIAC: RRR, no murmurs, no rubs, no gallops RESPIRATORY:  Clear to auscultation without rales, wheezing or rhonchi  ABDOMEN: Soft, non-tender, non-distended MUSCULOSKELETAL:  No edema; No deformity  SKIN: Warm and dry NEUROLOGIC:  Alert and oriented x 3 PSYCHIATRIC:  Normal affect   ASSESSMENT:    1. Leg swelling   2. Primary hypertension   3. PVC's (premature ventricular contractions)    4. Lewy body dementia with behavioral disturbance ([place])   5. Dyslipidemia    PLAN:    In order of problems listed above:  Leg swelling.  I will schedule her to have echocardiogram done to assess left ventricle ejection fraction.  I will also do proBNP as well as Chem-7 today.  Will also check direct LDL. Essential hypertension.  This is first visit in my office however her blood pressure seems to well-controlled I will continue present medications. Dyslipidemia I did review K PN which show me LDL 142 HDL 60, that is done in 2022 since that time she was put on Lipitor 40 and her daughter request to have her cholesterol checked which I will do. Snoring I suspect she may have sleep apnea but daughter tell me that there is no way she can handle sleep study or CPAP mask, therefore will not push for investigation of that. Her daughter tells me that she coughs a lot.  Apparently does have allergy to lisinopril.  I decided to stop benazepril hydrochlorothiazide will replace benazepril with losartan 25.  Will continue with medical chlorothiazide   Medication Adjustments/Labs and Tests Ordered: Current medicines are reviewed at length with the patient today.  Concerns regarding medicines are outlined above.  No orders of the defined types were placed in this encounter.  No orders of the defined types were placed in this encounter.   Signed, Park Liter, MD, [facility]. 06/24/2022 2:30 PM    [place]

## 2022-06-25 LAB — BASIC METABOLIC PANEL
BUN/Creatinine Ratio: 15 (ref 12–28)
BUN: 14 mg/dL (ref 8–27)
CO2: 27 mmol/L (ref 20–29)
Calcium: 10 mg/dL (ref 8.7–10.3)
Chloride: 100 mmol/L (ref 96–106)
Creatinine, Ser: 0.93 mg/dL (ref 0.57–1.00)
Glucose: 96 mg/dL (ref 70–99)
Potassium: 4.5 mmol/L (ref 3.5–5.2)
Sodium: 139 mmol/L (ref 134–144)
eGFR: 61 mL/min/{1.73_m2} (ref >59)

## 2022-07-02 ENCOUNTER — Other Ambulatory Visit: Payer: Medicare HMO

## 2022-07-09 ENCOUNTER — Ambulatory Visit: Payer: Medicare HMO | Attending: Cardiology

## 2022-07-09 DIAGNOSIS — R0609 Other forms of dyspnea: Secondary | ICD-10-CM | POA: Diagnosis not present

## 2022-07-09 LAB — ECHOCARDIOGRAM COMPLETE
Area-P 1/2: 2.87 cm2
P 1/2 time: 484 msec
S' Lateral: 2.4 cm

## 2022-07-12 ENCOUNTER — Telehealth: Payer: Self-pay

## 2022-07-12 NOTE — Telephone Encounter (Signed)
Results reviewed with pt's daughter Mrs. [name] as per Dr. Wendy Poet note.  Daughter verbalized understanding and had no additional questions. Routed to PCP

## 2022-07-15 DIAGNOSIS — F02B18 Dementia in other diseases classified elsewhere, moderate, with other behavioral disturbance: Secondary | ICD-10-CM | POA: Diagnosis not present

## 2022-07-15 DIAGNOSIS — G3183 Dementia with Lewy bodies: Secondary | ICD-10-CM | POA: Diagnosis not present

## 2022-07-15 DIAGNOSIS — F331 Major depressive disorder, recurrent, moderate: Secondary | ICD-10-CM | POA: Diagnosis not present

## 2022-07-15 DIAGNOSIS — F5101 Primary insomnia: Secondary | ICD-10-CM | POA: Diagnosis not present

## 2022-07-25 DIAGNOSIS — R051 Acute cough: Secondary | ICD-10-CM | POA: Diagnosis not present

## 2022-07-29 DIAGNOSIS — R630 Anorexia: Secondary | ICD-10-CM

## 2022-07-29 DIAGNOSIS — J09X2 Influenza due to identified novel influenza A virus with other respiratory manifestations: Secondary | ICD-10-CM

## 2022-07-29 DIAGNOSIS — F0390 Unspecified dementia without behavioral disturbance: Secondary | ICD-10-CM

## 2022-07-29 DIAGNOSIS — R059 Cough, unspecified: Secondary | ICD-10-CM

## 2022-08-05 DIAGNOSIS — R269 Unspecified abnormalities of gait and mobility: Secondary | ICD-10-CM | POA: Diagnosis not present

## 2022-08-05 DIAGNOSIS — M6281 Muscle weakness (generalized): Secondary | ICD-10-CM | POA: Diagnosis not present

## 2022-08-05 DIAGNOSIS — R4182 Altered mental status, unspecified: Secondary | ICD-10-CM | POA: Diagnosis not present

## 2022-08-05 DIAGNOSIS — Z9181 History of falling: Secondary | ICD-10-CM | POA: Diagnosis not present

## 2022-08-05 DIAGNOSIS — W19XXXA Unspecified fall, initial encounter: Secondary | ICD-10-CM | POA: Diagnosis not present

## 2022-08-05 DIAGNOSIS — Z87440 Personal history of urinary (tract) infections: Secondary | ICD-10-CM | POA: Diagnosis not present

## 2022-08-06 DIAGNOSIS — N390 Urinary tract infection, site not specified: Secondary | ICD-10-CM | POA: Diagnosis not present

## 2022-08-12 DIAGNOSIS — Z87440 Personal history of urinary (tract) infections: Secondary | ICD-10-CM | POA: Diagnosis not present

## 2022-08-12 DIAGNOSIS — B9620 Unspecified Escherichia coli [E. coli] as the cause of diseases classified elsewhere: Secondary | ICD-10-CM | POA: Diagnosis not present

## 2022-08-12 DIAGNOSIS — R4182 Altered mental status, unspecified: Secondary | ICD-10-CM | POA: Diagnosis not present

## 2022-08-12 DIAGNOSIS — N390 Urinary tract infection, site not specified: Secondary | ICD-10-CM | POA: Diagnosis not present

## 2022-08-19 DIAGNOSIS — F331 Major depressive disorder, recurrent, moderate: Secondary | ICD-10-CM | POA: Diagnosis not present

## 2022-08-19 DIAGNOSIS — G3183 Dementia with Lewy bodies: Secondary | ICD-10-CM | POA: Diagnosis not present

## 2022-08-19 DIAGNOSIS — F5101 Primary insomnia: Secondary | ICD-10-CM | POA: Diagnosis not present

## 2022-08-19 DIAGNOSIS — F02B18 Dementia in other diseases classified elsewhere, moderate, with other behavioral disturbance: Secondary | ICD-10-CM | POA: Diagnosis not present

## 2022-08-24 DIAGNOSIS — E782 Mixed hyperlipidemia: Secondary | ICD-10-CM | POA: Diagnosis not present

## 2022-08-24 DIAGNOSIS — D518 Other vitamin B12 deficiency anemias: Secondary | ICD-10-CM | POA: Diagnosis not present

## 2022-08-24 DIAGNOSIS — Z79899 Other long term (current) drug therapy: Secondary | ICD-10-CM | POA: Diagnosis not present

## 2022-08-24 DIAGNOSIS — E038 Other specified hypothyroidism: Secondary | ICD-10-CM | POA: Diagnosis not present

## 2022-08-24 DIAGNOSIS — E119 Type 2 diabetes mellitus without complications: Secondary | ICD-10-CM | POA: Diagnosis not present

## 2022-08-25 DIAGNOSIS — N390 Urinary tract infection, site not specified: Secondary | ICD-10-CM | POA: Diagnosis not present

## 2022-08-26 DIAGNOSIS — K219 Gastro-esophageal reflux disease without esophagitis: Secondary | ICD-10-CM | POA: Diagnosis not present

## 2022-08-26 DIAGNOSIS — E785 Hyperlipidemia, unspecified: Secondary | ICD-10-CM | POA: Diagnosis not present

## 2022-08-26 DIAGNOSIS — E039 Hypothyroidism, unspecified: Secondary | ICD-10-CM | POA: Diagnosis not present

## 2022-08-26 DIAGNOSIS — I119 Hypertensive heart disease without heart failure: Secondary | ICD-10-CM | POA: Diagnosis not present

## 2022-08-26 DIAGNOSIS — F0390 Unspecified dementia without behavioral disturbance: Secondary | ICD-10-CM | POA: Diagnosis not present

## 2022-08-26 DIAGNOSIS — E559 Vitamin D deficiency, unspecified: Secondary | ICD-10-CM | POA: Diagnosis not present

## 2022-08-31 DIAGNOSIS — Z79899 Other long term (current) drug therapy: Secondary | ICD-10-CM | POA: Diagnosis not present

## 2022-08-31 DIAGNOSIS — E559 Vitamin D deficiency, unspecified: Secondary | ICD-10-CM | POA: Diagnosis not present

## 2022-08-31 DIAGNOSIS — E782 Mixed hyperlipidemia: Secondary | ICD-10-CM | POA: Diagnosis not present

## 2022-08-31 DIAGNOSIS — D518 Other vitamin B12 deficiency anemias: Secondary | ICD-10-CM | POA: Diagnosis not present

## 2022-08-31 DIAGNOSIS — E038 Other specified hypothyroidism: Secondary | ICD-10-CM | POA: Diagnosis not present

## 2022-09-07 DIAGNOSIS — E038 Other specified hypothyroidism: Secondary | ICD-10-CM | POA: Diagnosis not present

## 2022-09-07 DIAGNOSIS — Z79899 Other long term (current) drug therapy: Secondary | ICD-10-CM | POA: Diagnosis not present

## 2022-09-07 DIAGNOSIS — D518 Other vitamin B12 deficiency anemias: Secondary | ICD-10-CM | POA: Diagnosis not present

## 2022-09-16 DIAGNOSIS — F331 Major depressive disorder, recurrent, moderate: Secondary | ICD-10-CM | POA: Diagnosis not present

## 2022-09-16 DIAGNOSIS — F02B18 Dementia in other diseases classified elsewhere, moderate, with other behavioral disturbance: Secondary | ICD-10-CM | POA: Diagnosis not present

## 2022-09-16 DIAGNOSIS — G3183 Dementia with Lewy bodies: Secondary | ICD-10-CM | POA: Diagnosis not present

## 2022-09-16 DIAGNOSIS — F5101 Primary insomnia: Secondary | ICD-10-CM | POA: Diagnosis not present

## 2022-09-22 DIAGNOSIS — I7091 Generalized atherosclerosis: Secondary | ICD-10-CM | POA: Diagnosis not present

## 2022-09-22 DIAGNOSIS — B351 Tinea unguium: Secondary | ICD-10-CM | POA: Diagnosis not present

## 2022-09-24 ENCOUNTER — Ambulatory Visit: Payer: Medicare HMO | Admitting: Cardiology

## 2022-10-14 DIAGNOSIS — F5101 Primary insomnia: Secondary | ICD-10-CM | POA: Diagnosis not present

## 2022-10-14 DIAGNOSIS — Z87440 Personal history of urinary (tract) infections: Secondary | ICD-10-CM | POA: Diagnosis not present

## 2022-10-14 DIAGNOSIS — R4182 Altered mental status, unspecified: Secondary | ICD-10-CM | POA: Diagnosis not present

## 2022-10-14 DIAGNOSIS — G3183 Dementia with Lewy bodies: Secondary | ICD-10-CM | POA: Diagnosis not present

## 2022-10-14 DIAGNOSIS — F02B18 Dementia in other diseases classified elsewhere, moderate, with other behavioral disturbance: Secondary | ICD-10-CM | POA: Diagnosis not present

## 2022-10-14 DIAGNOSIS — F331 Major depressive disorder, recurrent, moderate: Secondary | ICD-10-CM | POA: Diagnosis not present

## 2022-10-14 DIAGNOSIS — N390 Urinary tract infection, site not specified: Secondary | ICD-10-CM | POA: Diagnosis not present

## 2022-10-14 DIAGNOSIS — F0390 Unspecified dementia without behavioral disturbance: Secondary | ICD-10-CM | POA: Diagnosis not present

## 2022-10-18 DIAGNOSIS — F4310 Post-traumatic stress disorder, unspecified: Secondary | ICD-10-CM | POA: Diagnosis not present

## 2022-10-18 DIAGNOSIS — F29 Unspecified psychosis not due to a substance or known physiological condition: Secondary | ICD-10-CM | POA: Diagnosis not present

## 2022-10-18 DIAGNOSIS — F3341 Major depressive disorder, recurrent, in partial remission: Secondary | ICD-10-CM | POA: Diagnosis not present

## 2022-10-18 DIAGNOSIS — G3183 Dementia with Lewy bodies: Secondary | ICD-10-CM | POA: Diagnosis not present

## 2022-10-18 DIAGNOSIS — F02818 Dementia in other diseases classified elsewhere, unspecified severity, with other behavioral disturbance: Secondary | ICD-10-CM | POA: Diagnosis not present

## 2022-10-18 DIAGNOSIS — F411 Generalized anxiety disorder: Secondary | ICD-10-CM | POA: Diagnosis not present

## 2022-10-18 DIAGNOSIS — Z9889 Other specified postprocedural states: Secondary | ICD-10-CM | POA: Diagnosis not present

## 2022-10-21 DIAGNOSIS — N390 Urinary tract infection, site not specified: Secondary | ICD-10-CM | POA: Diagnosis not present

## 2022-10-21 DIAGNOSIS — R4182 Altered mental status, unspecified: Secondary | ICD-10-CM | POA: Diagnosis not present

## 2022-10-21 DIAGNOSIS — Z87440 Personal history of urinary (tract) infections: Secondary | ICD-10-CM | POA: Diagnosis not present

## 2022-10-26 DIAGNOSIS — N390 Urinary tract infection, site not specified: Secondary | ICD-10-CM | POA: Diagnosis not present

## 2022-10-28 DIAGNOSIS — N390 Urinary tract infection, site not specified: Secondary | ICD-10-CM | POA: Diagnosis not present

## 2022-10-28 DIAGNOSIS — Z87440 Personal history of urinary (tract) infections: Secondary | ICD-10-CM | POA: Diagnosis not present

## 2022-10-28 DIAGNOSIS — F0390 Unspecified dementia without behavioral disturbance: Secondary | ICD-10-CM | POA: Diagnosis not present

## 2022-10-28 DIAGNOSIS — M545 Low back pain, unspecified: Secondary | ICD-10-CM | POA: Diagnosis not present

## 2022-11-01 DIAGNOSIS — R109 Unspecified abdominal pain: Secondary | ICD-10-CM | POA: Diagnosis not present

## 2022-11-02 DIAGNOSIS — E782 Mixed hyperlipidemia: Secondary | ICD-10-CM | POA: Diagnosis not present

## 2022-11-02 DIAGNOSIS — Z79899 Other long term (current) drug therapy: Secondary | ICD-10-CM | POA: Diagnosis not present

## 2022-11-02 DIAGNOSIS — E119 Type 2 diabetes mellitus without complications: Secondary | ICD-10-CM | POA: Diagnosis not present

## 2022-11-02 DIAGNOSIS — D518 Other vitamin B12 deficiency anemias: Secondary | ICD-10-CM | POA: Diagnosis not present

## 2022-11-11 DIAGNOSIS — F02B18 Dementia in other diseases classified elsewhere, moderate, with other behavioral disturbance: Secondary | ICD-10-CM | POA: Diagnosis not present

## 2022-11-11 DIAGNOSIS — F331 Major depressive disorder, recurrent, moderate: Secondary | ICD-10-CM | POA: Diagnosis not present

## 2022-11-11 DIAGNOSIS — F5101 Primary insomnia: Secondary | ICD-10-CM | POA: Diagnosis not present

## 2022-11-11 DIAGNOSIS — G3183 Dementia with Lewy bodies: Secondary | ICD-10-CM | POA: Diagnosis not present

## 2022-11-17 DIAGNOSIS — N390 Urinary tract infection, site not specified: Secondary | ICD-10-CM | POA: Diagnosis not present

## 2022-11-25 DIAGNOSIS — Z87440 Personal history of urinary (tract) infections: Secondary | ICD-10-CM | POA: Diagnosis not present

## 2022-11-25 DIAGNOSIS — F0390 Unspecified dementia without behavioral disturbance: Secondary | ICD-10-CM | POA: Diagnosis not present

## 2022-11-25 DIAGNOSIS — B952 Enterococcus as the cause of diseases classified elsewhere: Secondary | ICD-10-CM | POA: Diagnosis not present

## 2022-11-25 DIAGNOSIS — N390 Urinary tract infection, site not specified: Secondary | ICD-10-CM | POA: Diagnosis not present

## 2022-11-30 DIAGNOSIS — N390 Urinary tract infection, site not specified: Secondary | ICD-10-CM | POA: Diagnosis not present

## 2022-11-30 DIAGNOSIS — B351 Tinea unguium: Secondary | ICD-10-CM | POA: Diagnosis not present

## 2022-11-30 DIAGNOSIS — N952 Postmenopausal atrophic vaginitis: Secondary | ICD-10-CM | POA: Diagnosis not present

## 2022-11-30 DIAGNOSIS — Z79899 Other long term (current) drug therapy: Secondary | ICD-10-CM | POA: Diagnosis not present

## 2022-11-30 DIAGNOSIS — I7091 Generalized atherosclerosis: Secondary | ICD-10-CM | POA: Diagnosis not present

## 2022-12-01 ENCOUNTER — Ambulatory Visit: Payer: Medicare HMO | Admitting: Cardiology

## 2022-12-08 DIAGNOSIS — E039 Hypothyroidism, unspecified: Secondary | ICD-10-CM | POA: Diagnosis not present

## 2022-12-08 DIAGNOSIS — F32A Depression, unspecified: Secondary | ICD-10-CM | POA: Diagnosis not present

## 2022-12-08 DIAGNOSIS — I10 Essential (primary) hypertension: Secondary | ICD-10-CM | POA: Diagnosis not present

## 2022-12-08 DIAGNOSIS — G4709 Other insomnia: Secondary | ICD-10-CM | POA: Diagnosis not present

## 2022-12-08 DIAGNOSIS — E785 Hyperlipidemia, unspecified: Secondary | ICD-10-CM | POA: Diagnosis not present

## 2022-12-08 DIAGNOSIS — R440 Auditory hallucinations: Secondary | ICD-10-CM | POA: Diagnosis not present

## 2022-12-09 DIAGNOSIS — F4312 Post-traumatic stress disorder, chronic: Secondary | ICD-10-CM | POA: Diagnosis not present

## 2022-12-09 DIAGNOSIS — F02B18 Dementia in other diseases classified elsewhere, moderate, with other behavioral disturbance: Secondary | ICD-10-CM | POA: Diagnosis not present

## 2022-12-09 DIAGNOSIS — G3183 Dementia with Lewy bodies: Secondary | ICD-10-CM | POA: Diagnosis not present

## 2022-12-09 DIAGNOSIS — N390 Urinary tract infection, site not specified: Secondary | ICD-10-CM | POA: Diagnosis not present

## 2022-12-09 DIAGNOSIS — F5101 Primary insomnia: Secondary | ICD-10-CM | POA: Diagnosis not present

## 2022-12-09 DIAGNOSIS — F331 Major depressive disorder, recurrent, moderate: Secondary | ICD-10-CM | POA: Diagnosis not present

## 2022-12-10 DIAGNOSIS — R440 Auditory hallucinations: Secondary | ICD-10-CM | POA: Diagnosis not present

## 2022-12-10 DIAGNOSIS — E785 Hyperlipidemia, unspecified: Secondary | ICD-10-CM | POA: Diagnosis not present

## 2022-12-10 DIAGNOSIS — I10 Essential (primary) hypertension: Secondary | ICD-10-CM | POA: Diagnosis not present

## 2022-12-10 DIAGNOSIS — F32A Depression, unspecified: Secondary | ICD-10-CM | POA: Diagnosis not present

## 2022-12-15 DIAGNOSIS — F32A Depression, unspecified: Secondary | ICD-10-CM | POA: Diagnosis not present

## 2022-12-15 DIAGNOSIS — E785 Hyperlipidemia, unspecified: Secondary | ICD-10-CM | POA: Diagnosis not present

## 2022-12-15 DIAGNOSIS — R440 Auditory hallucinations: Secondary | ICD-10-CM | POA: Diagnosis not present

## 2022-12-15 DIAGNOSIS — I10 Essential (primary) hypertension: Secondary | ICD-10-CM | POA: Diagnosis not present

## 2022-12-15 DIAGNOSIS — G4709 Other insomnia: Secondary | ICD-10-CM | POA: Diagnosis not present

## 2022-12-15 DIAGNOSIS — E039 Hypothyroidism, unspecified: Secondary | ICD-10-CM | POA: Diagnosis not present

## 2022-12-20 ENCOUNTER — Emergency Department (HOSPITAL_COMMUNITY)
Admission: EM | Admit: 2022-12-20 | Discharge: 2022-12-20 | Disposition: A | Discharge status: Home / Self Care | Payer: Medicare HMO | Attending: Student | Admitting: Student

## 2022-12-20 ENCOUNTER — Other Ambulatory Visit: Payer: Self-pay

## 2022-12-20 ENCOUNTER — Emergency Department (HOSPITAL_COMMUNITY): Payer: Medicare HMO

## 2022-12-20 ENCOUNTER — Encounter (HOSPITAL_COMMUNITY): Payer: Self-pay | Admitting: Emergency Medicine

## 2022-12-20 DIAGNOSIS — E039 Hypothyroidism, unspecified: Secondary | ICD-10-CM | POA: Diagnosis not present

## 2022-12-20 DIAGNOSIS — I499 Cardiac arrhythmia, unspecified: Secondary | ICD-10-CM | POA: Diagnosis not present

## 2022-12-20 DIAGNOSIS — R404 Transient alteration of awareness: Secondary | ICD-10-CM | POA: Diagnosis not present

## 2022-12-20 DIAGNOSIS — R410 Disorientation, unspecified: Secondary | ICD-10-CM | POA: Diagnosis not present

## 2022-12-20 DIAGNOSIS — I10 Essential (primary) hypertension: Secondary | ICD-10-CM | POA: Diagnosis not present

## 2022-12-20 DIAGNOSIS — Z79899 Other long term (current) drug therapy: Secondary | ICD-10-CM | POA: Insufficient documentation

## 2022-12-20 DIAGNOSIS — F0390 Unspecified dementia without behavioral disturbance: Secondary | ICD-10-CM | POA: Insufficient documentation

## 2022-12-20 DIAGNOSIS — N3001 Acute cystitis with hematuria: Secondary | ICD-10-CM | POA: Diagnosis not present

## 2022-12-20 DIAGNOSIS — Z7982 Long term (current) use of aspirin: Secondary | ICD-10-CM | POA: Insufficient documentation

## 2022-12-20 DIAGNOSIS — I6782 Cerebral ischemia: Secondary | ICD-10-CM | POA: Diagnosis not present

## 2022-12-20 DIAGNOSIS — R531 Weakness: Secondary | ICD-10-CM | POA: Diagnosis not present

## 2022-12-20 DIAGNOSIS — R55 Syncope and collapse: Secondary | ICD-10-CM | POA: Diagnosis not present

## 2022-12-20 LAB — BASIC METABOLIC PANEL
Anion gap: 14 (ref 5–15)
BUN: 14 mg/dL (ref 8–23)
CO2: 23 mmol/L (ref 22–32)
Calcium: 9.8 mg/dL (ref 8.9–10.3)
Chloride: 100 mmol/L (ref 98–111)
Creatinine, Ser: 0.96 mg/dL (ref 0.44–1.00)
GFR, Estimated: 58 mL/min — ABNORMAL LOW (ref >60)
Glucose, Bld: 162 mg/dL — ABNORMAL HIGH (ref 70–99)
Potassium: 3.8 mmol/L (ref 3.5–5.1)
Sodium: 137 mmol/L (ref 135–145)

## 2022-12-20 LAB — RAPID URINE DRUG SCREEN, HOSP PERFORMED
Amphetamines: NOT DETECTED
Barbiturates: NOT DETECTED
Benzodiazepines: NOT DETECTED
Cocaine: NOT DETECTED
Opiates: NOT DETECTED
Tetrahydrocannabinol: NOT DETECTED

## 2022-12-20 LAB — URINALYSIS, ROUTINE W REFLEX MICROSCOPIC
Bilirubin Urine: NEGATIVE
Glucose, UA: NEGATIVE mg/dL
Hgb urine dipstick: NEGATIVE
Ketones, ur: NEGATIVE mg/dL
Nitrite: NEGATIVE
Protein, ur: NEGATIVE mg/dL
Specific Gravity, Urine: 1.014 (ref 1.005–1.030)
WBC, UA: >50 WBC/hpf (ref 0–5)
pH: 6 (ref 5.0–8.0)

## 2022-12-20 LAB — CBC
HCT: 42.1 % (ref 36.0–46.0)
Hemoglobin: 14 g/dL (ref 12.0–15.0)
MCH: 30.3 pg (ref 26.0–34.0)
MCHC: 33.3 g/dL (ref 30.0–36.0)
MCV: 91.1 fL (ref 80.0–100.0)
Platelets: 205 10*3/uL (ref 150–400)
RBC: 4.62 MIL/uL (ref 3.87–5.11)
RDW: 12.5 % (ref 11.5–15.5)
WBC: 7.7 10*3/uL (ref 4.0–10.5)
nRBC: 0 % (ref 0.0–0.2)

## 2022-12-20 LAB — CBG MONITORING, ED: Glucose-Capillary: 135 mg/dL — ABNORMAL HIGH (ref 70–99)

## 2022-12-20 MED ORDER — FOSFOMYCIN TROMETHAMINE 3 G PO PACK
3.0000 g | PACK | Freq: Once | ORAL | Status: AC
  Administered 2022-12-20: 3 g via ORAL
  Filled 2022-12-20: qty 3

## 2022-12-20 NOTE — ED Notes (Signed)
Unable to in and out patient at this time, patient currently sitting in wheelchair, no rooms available.

## 2022-12-20 NOTE — ED Triage Notes (Signed)
Pt bib ems. Per EMS pt was found unresponsive at facility not responding to painful stimuli. Per daughter she arrived to visit pt around 3pm and pt was asleep in chair difficult to arouse. Per daughter unknown last known well. Staff told daughter that pt walked the halls sometime before lunch. Pt woke when EMS arrived and became more alert in route to ER. EMS stated pt had no cardiac history and had trigeminy on monitor. PT is alert and oriented to self, dob, place. Pt was not sure of today's date. Pt followed all [facility] commands. Pt is talking to daughter and stated daughters name correctly. Denies any pain.

## 2022-12-20 NOTE — ED Provider Triage Note (Signed)
Emergency Medicine Provider Triage Evaluation Note  Michele Parker , a 82 y.o. female  was evaluated in triage.  Pt complains of transient alteration of awareness.  Patient was reportedly last seen well at approximately 2 PM but when her daughter came to see her at 3 PM she was very somnolent asleep in her recliner.  She was difficult to awaken but by the time that she made it to the emergency department, she has regained normal baseline mental status.  Patient does have a history of chronic UTIs.  Family denies known ingestion or new medications  Review of Systems  Positive: Somnolence Negative: Chest pain, shortness of breath, [place] pain, nausea, vomiting  Physical Exam  BP (!) 159/109 (BP Location: Left Arm)   Pulse 66   Temp 97.8 F (36.6 C) (Oral)   Resp 17   Wt 73.2 kg   SpO2 96%   BMI 27.70 kg/m  Gen:   Awake, no distress   Resp:  Normal effort  MSK:   Moves extremities without difficulty    Medical Decision Making  Medically screening exam initiated at 7:16 PM.  Appropriate orders placed.  BRIGHID KOCH was informed that the remainder of the evaluation will be completed by another provider, this initial triage assessment does not replace that evaluation, and the importance of remaining in the ED until their evaluation is complete.     Michele Score, MD 12/20/22 [phone]

## 2022-12-21 NOTE — ED Provider Notes (Signed)
[place] EMERGENCY DEPARTMENT AT [facility] Provider Note  CSN: [id number] Arrival date & time: 12/20/22 1836  Chief Complaint(s) Weakness  HPI Michele Parker is a 82 y.o. female With PMH Lewy body dementia, depression, HTN, HLD who presents To the emergency department for evaluation of transient alteration of awareness. History obtained from patient's daughter who states that at approximately 3 PM she was found in her recliner and very difficult to awaken. EMS was called and by the time that she arrives in the emergency department, she is back to her normal mental status baseline and alert. EMS was called and by the time that she arrives in the emergency department, she is back to her normal mental status baseline and alert. Chronic history of UTIs with normal urinalysis last week. No known medication changes or toxic ingestions today.    Past Medical History Past Medical History:  Diagnosis Date   Allergic rhinitis 08/31/2021   Allergies 02/08/2020   Angioedema    Arthritis 06/09/2016   Bradycardia    Cervicalgia    Depression    Depression, major, recurrent, moderate (HCC) 05/11/2021   Essential hypertension    Gait abnormality 09/27/2017   GERD (gastroesophageal reflux disease) 05/11/2021   Hemochromatosis    Hemorrhoid    Hereditary hemochromatosis (HCC)    HTN (hypertension)    Hypercalcemia    Hyperlipidemia    Hypothyroid    Hypothyroidism 05/11/2021   Insomnia 08/31/2021   Lewy body dementia with behavioral disturbance (HCC) 05/11/2021   MDD (major depressive disorder), recurrent, in partial remission (HCC) 09/27/2013   PHQ- 1 (July, 2016); PHQ-9 done - 06/19/15   Memory loss    Osteopenia    Osteopenia    Other amnesia    Other constipation    Other vitamin B12 deficiency anemias 08/31/2021   Palpitations 07/20/2013   Primary hemochromatosis (HCC) 07/30/2019   PVC's (premature ventricular contractions) 06/15/2021   Repeated falls    Tremor     Vitamin D deficiency    Patient Active Problem List   Diagnosis Date Noted   Leg swelling 06/24/2022   Allergic rhinitis 08/31/2021   Insomnia 08/31/2021   Vitamin D deficiency 08/31/2021   Other vitamin B12 deficiency anemias 08/31/2021   PVC's (premature ventricular contractions) 06/15/2021   Lewy body dementia with behavioral disturbance (HCC) 05/11/2021   Hypothyroidism 05/11/2021   GERD (gastroesophageal reflux disease) 05/11/2021   Depression, major, recurrent, moderate (HCC) 05/11/2021   Allergies 02/08/2020   Primary hemochromatosis (HCC) 07/30/2019   Gait abnormality 09/27/2017   Tremor 09/27/2017   Arthritis 06/09/2016   MDD (major depressive disorder), recurrent, in partial remission (HCC) 09/27/2013   HTN (hypertension) 07/20/2013   Dyslipidemia 07/20/2013   Palpitations 07/20/2013   Home Medication(s) Prior to Admission medications   Medication Sig Start Date End Date Taking? Authorizing Provider  acetaminophen (TYLENOL) 325 MG tablet Take 650 mg by mouth every 6 (six) hours as needed (back pain, fever of 100.4).   Yes [provider]  aspirin 81 MG chewable tablet Chew 81 mg by mouth daily.   Yes [provider]  atorvastatin (LIPITOR) 40 MG tablet Take 40 mg by mouth at bedtime. 03/04/22  Yes [provider]  benzonatate (TESSALON) 200 MG capsule Take 200 mg by mouth every 8 (eight) hours as needed for cough.   Yes [provider]  cetirizine (ZYRTEC) 5 MG tablet Take 5 mg by mouth at bedtime.   Yes [provider]  cholecalciferol (VITAMIN D3)  25 MCG (1000 UNIT) tablet Take 1,000 Units by mouth daily.   Yes [provider]  famotidine (PEPCID) 20 MG tablet Take 20 mg by mouth at bedtime. 06/15/19  Yes [provider]  FLUoxetine (PROZAC) 20 MG tablet Take 20 mg by mouth daily.   Yes [provider]  guaiFENesin (GERI-TUSSIN) 100 MG/5ML liquid Take 200 mg by mouth every 4 (four) hours as needed for  cough.   Yes [provider]  hydrALAZINE (APRESOLINE) 25 MG tablet Take 25 mg by mouth 2 (two) times daily.   Yes [provider]  hydrochlorothiazide (MICROZIDE) 12.5 MG capsule Take 1 capsule (12.5 mg total) by mouth daily. 06/24/22  Yes Georgeanna Lea, MD  levothyroxine (SYNTHROID) 50 MCG tablet Take 50 mcg by mouth daily before breakfast.   Yes [provider]  losartan (COZAAR) 25 MG tablet Take 1 tablet (25 mg total) by mouth daily. 06/24/22  Yes Georgeanna Lea, MD  nitrofurantoin, macrocrystal-monohydrate, (MACROBID) 100 MG capsule Take 100 mg by mouth daily. UTI prevention.   Yes [provider]  nystatin cream (MYCOSTATIN) Apply 1 Application topically See admin instructions. 2 entries on MAR: 1)1g topically to vaginal area and skin folds up to 4 times daily as needed for yeast infection/skin rashes 2) apply topically twice daily as needed in folds due to redness and irritation   Yes [provider]  OLANZapine (ZYPREXA) 5 MG tablet Take 5 mg by mouth at bedtime.   Yes [provider]  ondansetron (ZOFRAN) 4 MG tablet Take 4 mg by mouth every 6 (six) hours as needed for nausea or vomiting.   Yes [provider]  potassium chloride (MICRO-K) 10 MEQ CR capsule TAKE 1 CAPSULE EVERY DAY 10/22/20  Yes [name], [name], MD  traZODone (DESYREL) 50 MG tablet Take 50 mg by mouth at bedtime. 04/24/21 02/05/23 Yes [provider]                                                                                                                                    Past Surgical History Past Surgical History:  Procedure Laterality Date   CHOLECYSTECTOMY     KNEE SURGERY     MINOR HEMORRHOIDECTOMY     SHOULDER SURGERY     Fracture   TUBAL LIGATION     Family History Family History  Problem Relation Age of Onset   Heart attack Mother    Heart disease Mother    Depression Mother    Stroke Mother    Other Father         unsure of medical hx - died in MVA when patient was 32 years old   CAD Brother    Alcohol abuse Brother    Hypertension Brother     Social History Social History   Tobacco Use   Smoking status: Never   Smokeless tobacco: Never  Vaping Use   Vaping  status: Never Used  Substance Use Topics   Alcohol use: No   Drug use: No   Allergies Bystolic [nebivolol hcl], Aricept [donepezil], Zestril [lisinopril], Darvon [propoxyphene], and Oxycontin [oxycodone]  Review of Systems Review of Systems  Psychiatric/Behavioral:  Positive for confusion.     Physical Exam Vital Signs  I have reviewed the triage vital signs BP (!) 161/74   Pulse (!) 57   Temp (!) 97.3 F (36.3 C) (Oral)   Resp 16   Wt 73.2 kg   SpO2 90%   BMI 27.70 kg/m   Physical Exam Vitals and nursing note reviewed.  Constitutional:      General: She is not in acute distress.    Appearance: She is well-developed.  HENT:     Head: Normocephalic and atraumatic.  Eyes:     Conjunctiva/sclera: Conjunctivae normal.  Cardiovascular:     Rate and Rhythm: Normal rate and regular rhythm.     Heart sounds: No murmur heard. Pulmonary:     Effort: Pulmonary effort is normal. No respiratory distress.     Breath sounds: Normal breath sounds.  Abdominal:     Palpations: Abdomen is soft.     Tenderness: There is no abdominal tenderness.  Musculoskeletal:        General: No swelling.     Cervical back: Neck supple.  Skin:    General: Skin is warm and dry.     Capillary Refill: Capillary refill takes less than 2 seconds.  Neurological:     Mental Status: She is alert.  Psychiatric:        Mood and Affect: Mood normal.     ED Results and Treatments Labs (all labs ordered are listed, but only abnormal results are displayed) Labs Reviewed  BASIC METABOLIC PANEL - Abnormal; Notable for the following components:      Result Value   Glucose, Bld 162 (*)    GFR, Estimated 58 (*)    All other components within normal  limits  URINALYSIS, ROUTINE W REFLEX MICROSCOPIC - Abnormal; Notable for the following components:   APPearance HAZY (*)    Leukocytes,Ua LARGE (*)    Bacteria, UA RARE (*)    Non Squamous Epithelial 6-10 (*)    All other components within normal limits  CBG MONITORING, ED - Abnormal; Notable for the following components:   Glucose-Capillary 135 (*)    All other components within normal limits  URINE CULTURE  CBC  RAPID URINE DRUG SCREEN, HOSP PERFORMED                                                                                                                          Radiology CT Head Wo Contrast  Result Date: 12/20/2022 CLINICAL DATA:  Delirium EXAM: CT HEAD WITHOUT CONTRAST TECHNIQUE: Contiguous axial images were obtained from the base of the skull through the vertex without intravenous contrast. RADIATION DOSE REDUCTION: This exam was performed according to the departmental dose-optimization program which includes automated  exposure control, adjustment of the mA and/or kV according to patient size and/or use of iterative reconstruction technique. COMPARISON:  CT head 08/27/21 FINDINGS: Brain: No evidence of acute infarction, hemorrhage, hydrocephalus, extra-axial collection or mass lesion/mass effect. Sequela of mild chronic microvascular ischemic change. Vascular: No hyperdense vessel or unexpected calcification. Skull: Normal. Negative for fracture or focal lesion. Sinuses/Orbits: No middle ear or mastoid effusion. Mucosal thickening of the floor of the right maxillary sinus. Orbits are unremarkable. Other: None. IMPRESSION: No acute intracranial abnormality. Electronically Signed   By: Lorenza Cambridge M.D.   On: 12/20/2022 20:39    Pertinent labs & imaging results that were available during my care of the patient were reviewed by me and considered in my medical decision making (see MDM for details).  Medications Ordered in ED Medications  fosfomycin (MONUROL) packet 3 g (3 g Oral  Given 12/20/22 2212)                                                                                                                                     Procedures Procedures  (including critical care time)  Medical Decision Making / ED Course   This patient presents to the ED for concern of Transient alteration of awareness, this involves an extensive number of treatment options, and is a complaint that carries with it a high risk of complications and morbidity.  The differential diagnosis includes Electrolyte Abnormality, UTI, polypharmacy, toxic and cephalopathy, intracranial hemorrhage, Delirium, progression of underlying dementia  MDM: Patient seen in the emergency department for evaluation of transient alteration of awareness. Physical exam is unremarkable with no focal motor or Sensory deficits. Laboratory evaluation is largely unremarkable. CT head With no acute intracranial pathology. Urinalysis is concerning for infection with large leuk esterase, >50 WBCs, rare bacteria but there are 6-10 squamous cells. Absence additional explanation for patient's transient alteration of awareness today, and known history of complicated UTIs, we will culture the urine and treat with fosfomycin today. Throughout her ER stay, patient did not have an Additional episode of delirium or altered mental status. At this time, she does not meet inpatient criteria for admission and she will be discharged back to her facility. Family given return precautions I wish they voiced understanding   Additional history obtained: -Additional history obtained from multiple family members  and reviewed including: Chart review including previous notes, labs, imaging, consultation notes   Lab Tests: -I ordered, reviewed, and interpreted labs.   The pertinent results include:   Labs Reviewed  BASIC METABOLIC PANEL - Abnormal; Notable for the following components:      Result Value   Glucose, Bld 162 (*)    GFR,  Estimated 58 (*)    All other components within normal limits  URINALYSIS, ROUTINE W REFLEX MICROSCOPIC - Abnormal; Notable for the following components:   APPearance HAZY (*)    Leukocytes,Ua LARGE (*)    Bacteria, UA RARE (*)  Non Squamous Epithelial 6-10 (*)    All other components within normal limits  CBG MONITORING, ED - Abnormal; Notable for the following components:   Glucose-Capillary 135 (*)    All other components within normal limits  URINE CULTURE  CBC  RAPID URINE DRUG SCREEN, HOSP PERFORMED      EKG   EKG Interpretation Date/Time:  Monday December 20 2022 18:44:46 EDT Ventricular Rate:  64 PR Interval:  186 QRS Duration:  84 QT Interval:  440 QTC Calculation: 453 R Axis:   10  Text Interpretation: Normal sinus rhythm Nonspecific ST and T wave abnormality Abnormal ECG When compared with ECG of 16-Sep-1998 15:58, PREVIOUS ECG IS PRESENT Artifact Confirmed by Drema Pry [phone]) on 12/21/2022 12:08:51 PM         Imaging Studies ordered: I ordered imaging studies including CTH I independently visualized and interpreted imaging. I agree with the radiologist interpretation   Medicines ordered and prescription drug management: Meds ordered this encounter  Medications   fosfomycin (MONUROL) packet 3 g    -I have reviewed the patients home medicines and have made adjustments as needed  Critical interventions none   Cardiac Monitoring: The patient was maintained on a cardiac monitor.  I personally viewed and interpreted the cardiac monitored which showed an underlying rhythm of: NSR  Social Determinants of Health:  Factors impacting patients care include: Currently lives in facility   Reevaluation: After the interventions noted above, I reevaluated the patient and found that they have :improved  Co morbidities that complicate the patient evaluation  Past Medical History:  Diagnosis Date   Allergic rhinitis 08/31/2021   Allergies 02/08/2020    Angioedema    Arthritis 06/09/2016   Bradycardia    Cervicalgia    Depression    Depression, major, recurrent, moderate (HCC) 05/11/2021   Essential hypertension    Gait abnormality 09/27/2017   GERD (gastroesophageal reflux disease) 05/11/2021   Hemochromatosis    Hemorrhoid    Hereditary hemochromatosis (HCC)    HTN (hypertension)    Hypercalcemia    Hyperlipidemia    Hypothyroid    Hypothyroidism 05/11/2021   Insomnia 08/31/2021   Lewy body dementia with behavioral disturbance (HCC) 05/11/2021   MDD (major depressive disorder), recurrent, in partial remission (HCC) 09/27/2013   PHQ- 1 (July, 2016); PHQ-9 done - 06/19/15   Memory loss    Osteopenia    Osteopenia    Other amnesia    Other constipation    Other vitamin B12 deficiency anemias 08/31/2021   Palpitations 07/20/2013   Primary hemochromatosis (HCC) 07/30/2019   PVC's (premature ventricular contractions) 06/15/2021   Repeated falls    Tremor    Vitamin D deficiency       Dispostion: I considered admission for this patient, But at this time she does not meet inpatient criteria for admission and is safe for discharge back to her facili     Final Clinical Impression(s) / ED Diagnoses Final diagnoses:  Transient alteration of awareness  Acute cystitis with hematuria     @PCDICTATION @    Glendora Score, MD 12/21/22 1444

## 2022-12-22 LAB — URINE CULTURE

## 2022-12-30 DIAGNOSIS — N390 Urinary tract infection, site not specified: Secondary | ICD-10-CM | POA: Diagnosis not present

## 2023-01-06 DIAGNOSIS — F02B18 Dementia in other diseases classified elsewhere, moderate, with other behavioral disturbance: Secondary | ICD-10-CM | POA: Diagnosis not present

## 2023-01-06 DIAGNOSIS — F331 Major depressive disorder, recurrent, moderate: Secondary | ICD-10-CM | POA: Diagnosis not present

## 2023-01-06 DIAGNOSIS — F4312 Post-traumatic stress disorder, chronic: Secondary | ICD-10-CM | POA: Diagnosis not present

## 2023-01-06 DIAGNOSIS — G3183 Dementia with Lewy bodies: Secondary | ICD-10-CM | POA: Diagnosis not present

## 2023-01-06 DIAGNOSIS — F5105 Insomnia due to other mental disorder: Secondary | ICD-10-CM | POA: Diagnosis not present

## 2023-01-18 DIAGNOSIS — N390 Urinary tract infection, site not specified: Secondary | ICD-10-CM | POA: Diagnosis not present

## 2023-01-25 DIAGNOSIS — E038 Other specified hypothyroidism: Secondary | ICD-10-CM | POA: Diagnosis not present

## 2023-01-25 DIAGNOSIS — Z79899 Other long term (current) drug therapy: Secondary | ICD-10-CM | POA: Diagnosis not present

## 2023-01-26 DIAGNOSIS — N390 Urinary tract infection, site not specified: Secondary | ICD-10-CM | POA: Diagnosis not present

## 2023-01-26 DIAGNOSIS — F32A Depression, unspecified: Secondary | ICD-10-CM | POA: Diagnosis not present

## 2023-02-02 DIAGNOSIS — B379 Candidiasis, unspecified: Secondary | ICD-10-CM | POA: Diagnosis not present

## 2023-02-02 DIAGNOSIS — N390 Urinary tract infection, site not specified: Secondary | ICD-10-CM | POA: Diagnosis not present

## 2023-02-03 DIAGNOSIS — G3183 Dementia with Lewy bodies: Secondary | ICD-10-CM | POA: Diagnosis not present

## 2023-02-03 DIAGNOSIS — I7091 Generalized atherosclerosis: Secondary | ICD-10-CM | POA: Diagnosis not present

## 2023-02-03 DIAGNOSIS — B351 Tinea unguium: Secondary | ICD-10-CM | POA: Diagnosis not present

## 2023-02-03 DIAGNOSIS — F4312 Post-traumatic stress disorder, chronic: Secondary | ICD-10-CM | POA: Diagnosis not present

## 2023-02-03 DIAGNOSIS — F331 Major depressive disorder, recurrent, moderate: Secondary | ICD-10-CM | POA: Diagnosis not present

## 2023-02-03 DIAGNOSIS — F5105 Insomnia due to other mental disorder: Secondary | ICD-10-CM | POA: Diagnosis not present

## 2023-02-03 DIAGNOSIS — F02B18 Dementia in other diseases classified elsewhere, moderate, with other behavioral disturbance: Secondary | ICD-10-CM | POA: Diagnosis not present

## 2023-02-14 DIAGNOSIS — B3731 Acute candidiasis of vulva and vagina: Secondary | ICD-10-CM | POA: Diagnosis not present

## 2023-02-14 DIAGNOSIS — N390 Urinary tract infection, site not specified: Secondary | ICD-10-CM | POA: Diagnosis not present

## 2023-02-14 DIAGNOSIS — N952 Postmenopausal atrophic vaginitis: Secondary | ICD-10-CM | POA: Diagnosis not present

## 2023-02-15 DIAGNOSIS — R440 Auditory hallucinations: Secondary | ICD-10-CM | POA: Diagnosis not present

## 2023-02-15 DIAGNOSIS — F330 Major depressive disorder, recurrent, mild: Secondary | ICD-10-CM | POA: Diagnosis not present

## 2023-02-15 DIAGNOSIS — G4709 Other insomnia: Secondary | ICD-10-CM | POA: Diagnosis not present

## 2023-02-16 DIAGNOSIS — R0602 Shortness of breath: Secondary | ICD-10-CM | POA: Diagnosis not present

## 2023-02-16 DIAGNOSIS — R0989 Other specified symptoms and signs involving the circulatory and respiratory systems: Secondary | ICD-10-CM | POA: Diagnosis not present

## 2023-02-23 DIAGNOSIS — R440 Auditory hallucinations: Secondary | ICD-10-CM | POA: Diagnosis not present

## 2023-02-23 DIAGNOSIS — F330 Major depressive disorder, recurrent, mild: Secondary | ICD-10-CM | POA: Diagnosis not present

## 2023-03-02 DIAGNOSIS — N390 Urinary tract infection, site not specified: Secondary | ICD-10-CM | POA: Diagnosis not present

## 2023-03-09 DIAGNOSIS — N390 Urinary tract infection, site not specified: Secondary | ICD-10-CM | POA: Diagnosis not present

## 2023-03-09 DIAGNOSIS — B3731 Acute candidiasis of vulva and vagina: Secondary | ICD-10-CM | POA: Diagnosis not present

## 2023-03-15 DIAGNOSIS — R440 Auditory hallucinations: Secondary | ICD-10-CM | POA: Diagnosis not present

## 2023-03-15 DIAGNOSIS — F330 Major depressive disorder, recurrent, mild: Secondary | ICD-10-CM | POA: Diagnosis not present

## 2023-03-15 DIAGNOSIS — G4709 Other insomnia: Secondary | ICD-10-CM | POA: Diagnosis not present

## 2023-03-16 DIAGNOSIS — N390 Urinary tract infection, site not specified: Secondary | ICD-10-CM | POA: Diagnosis not present

## 2023-03-16 DIAGNOSIS — R5381 Other malaise: Secondary | ICD-10-CM | POA: Diagnosis not present

## 2023-03-18 DIAGNOSIS — E039 Hypothyroidism, unspecified: Secondary | ICD-10-CM | POA: Diagnosis not present

## 2023-03-18 DIAGNOSIS — E785 Hyperlipidemia, unspecified: Secondary | ICD-10-CM | POA: Diagnosis not present

## 2023-03-23 DIAGNOSIS — N390 Urinary tract infection, site not specified: Secondary | ICD-10-CM | POA: Diagnosis not present

## 2023-03-28 DIAGNOSIS — E785 Hyperlipidemia, unspecified: Secondary | ICD-10-CM | POA: Diagnosis not present

## 2023-03-28 DIAGNOSIS — E039 Hypothyroidism, unspecified: Secondary | ICD-10-CM | POA: Diagnosis not present

## 2023-04-06 DIAGNOSIS — N1831 Chronic kidney disease, stage 3a: Secondary | ICD-10-CM | POA: Diagnosis not present

## 2023-04-06 DIAGNOSIS — N390 Urinary tract infection, site not specified: Secondary | ICD-10-CM | POA: Diagnosis not present

## 2023-04-19 DIAGNOSIS — N390 Urinary tract infection, site not specified: Secondary | ICD-10-CM | POA: Diagnosis not present

## 2023-04-20 DIAGNOSIS — F330 Major depressive disorder, recurrent, mild: Secondary | ICD-10-CM | POA: Diagnosis not present

## 2023-04-20 DIAGNOSIS — N183 Chronic kidney disease, stage 3 unspecified: Secondary | ICD-10-CM | POA: Diagnosis not present

## 2023-04-20 DIAGNOSIS — N390 Urinary tract infection, site not specified: Secondary | ICD-10-CM | POA: Diagnosis not present

## 2023-04-22 DIAGNOSIS — G4709 Other insomnia: Secondary | ICD-10-CM | POA: Diagnosis not present

## 2023-04-22 DIAGNOSIS — F330 Major depressive disorder, recurrent, mild: Secondary | ICD-10-CM | POA: Diagnosis not present

## 2023-04-22 DIAGNOSIS — R440 Auditory hallucinations: Secondary | ICD-10-CM | POA: Diagnosis not present

## 2023-05-18 DIAGNOSIS — E785 Hyperlipidemia, unspecified: Secondary | ICD-10-CM | POA: Diagnosis not present

## 2023-05-18 DIAGNOSIS — I10 Essential (primary) hypertension: Secondary | ICD-10-CM | POA: Diagnosis not present

## 2023-05-18 DIAGNOSIS — N390 Urinary tract infection, site not specified: Secondary | ICD-10-CM | POA: Diagnosis not present

## 2023-05-18 DIAGNOSIS — G4709 Other insomnia: Secondary | ICD-10-CM | POA: Diagnosis not present

## 2023-05-18 DIAGNOSIS — F331 Major depressive disorder, recurrent, moderate: Secondary | ICD-10-CM | POA: Diagnosis not present

## 2023-05-18 DIAGNOSIS — E039 Hypothyroidism, unspecified: Secondary | ICD-10-CM | POA: Diagnosis not present

## 2023-05-18 DIAGNOSIS — N1831 Chronic kidney disease, stage 3a: Secondary | ICD-10-CM | POA: Diagnosis not present

## 2023-05-19 DIAGNOSIS — G4709 Other insomnia: Secondary | ICD-10-CM | POA: Diagnosis not present

## 2023-05-19 DIAGNOSIS — R440 Auditory hallucinations: Secondary | ICD-10-CM | POA: Diagnosis not present

## 2023-05-19 DIAGNOSIS — F330 Major depressive disorder, recurrent, mild: Secondary | ICD-10-CM | POA: Diagnosis not present

## 2023-05-27 DIAGNOSIS — N1831 Chronic kidney disease, stage 3a: Secondary | ICD-10-CM | POA: Diagnosis not present

## 2023-05-27 DIAGNOSIS — I10 Essential (primary) hypertension: Secondary | ICD-10-CM | POA: Diagnosis not present

## 2023-05-27 DIAGNOSIS — E039 Hypothyroidism, unspecified: Secondary | ICD-10-CM | POA: Diagnosis not present

## 2023-05-27 DIAGNOSIS — E785 Hyperlipidemia, unspecified: Secondary | ICD-10-CM | POA: Diagnosis not present

## 2023-05-30 DIAGNOSIS — R4182 Altered mental status, unspecified: Secondary | ICD-10-CM | POA: Diagnosis not present

## 2023-05-31 DIAGNOSIS — R350 Frequency of micturition: Secondary | ICD-10-CM | POA: Diagnosis not present

## 2023-05-31 DIAGNOSIS — R309 Painful micturition, unspecified: Secondary | ICD-10-CM | POA: Diagnosis not present

## 2023-06-03 DIAGNOSIS — I7091 Generalized atherosclerosis: Secondary | ICD-10-CM | POA: Diagnosis not present

## 2023-06-03 DIAGNOSIS — B351 Tinea unguium: Secondary | ICD-10-CM | POA: Diagnosis not present

## 2023-06-23 DIAGNOSIS — R440 Auditory hallucinations: Secondary | ICD-10-CM | POA: Diagnosis not present

## 2023-06-23 DIAGNOSIS — F330 Major depressive disorder, recurrent, mild: Secondary | ICD-10-CM | POA: Diagnosis not present

## 2023-06-23 DIAGNOSIS — G4709 Other insomnia: Secondary | ICD-10-CM | POA: Diagnosis not present

## 2023-06-27 DIAGNOSIS — N390 Urinary tract infection, site not specified: Secondary | ICD-10-CM | POA: Diagnosis not present

## 2023-07-06 DIAGNOSIS — R051 Acute cough: Secondary | ICD-10-CM | POA: Diagnosis not present

## 2023-07-15 DIAGNOSIS — R051 Acute cough: Secondary | ICD-10-CM | POA: Diagnosis not present

## 2023-07-21 DIAGNOSIS — F330 Major depressive disorder, recurrent, mild: Secondary | ICD-10-CM | POA: Diagnosis not present

## 2023-07-21 DIAGNOSIS — R440 Auditory hallucinations: Secondary | ICD-10-CM | POA: Diagnosis not present

## 2023-07-21 DIAGNOSIS — G4709 Other insomnia: Secondary | ICD-10-CM | POA: Diagnosis not present

## 2023-07-22 DIAGNOSIS — N390 Urinary tract infection, site not specified: Secondary | ICD-10-CM | POA: Diagnosis not present

## 2023-07-27 DIAGNOSIS — F331 Major depressive disorder, recurrent, moderate: Secondary | ICD-10-CM | POA: Diagnosis not present

## 2023-07-27 DIAGNOSIS — E785 Hyperlipidemia, unspecified: Secondary | ICD-10-CM | POA: Diagnosis not present

## 2023-07-27 DIAGNOSIS — I10 Essential (primary) hypertension: Secondary | ICD-10-CM | POA: Diagnosis not present

## 2023-07-27 DIAGNOSIS — E039 Hypothyroidism, unspecified: Secondary | ICD-10-CM | POA: Diagnosis not present

## 2023-08-03 DIAGNOSIS — J069 Acute upper respiratory infection, unspecified: Secondary | ICD-10-CM | POA: Diagnosis not present

## 2023-08-03 DIAGNOSIS — B379 Candidiasis, unspecified: Secondary | ICD-10-CM | POA: Diagnosis not present

## 2023-08-20 DIAGNOSIS — R059 Cough, unspecified: Secondary | ICD-10-CM | POA: Diagnosis not present

## 2023-08-22 ENCOUNTER — Telehealth: Payer: Self-pay

## 2023-08-22 NOTE — Telephone Encounter (Signed)
 Verbal ok given to Deidre, she requested at patient next appt for it to be documented as to why she needs the PT (due to weakness, Lewy Body dementia?)  Copied from CRM [id number]. Topic: Clinical - Home Health Verbal Orders >> Aug 18, 2023 11:00 AM Nyra Capes wrote: Caller/Agency: Deidre with [facility] Callback Number: [phone]  Service Requested: Physical Therapy Frequency: initial assessment for physical therapy Any new concerns about the patient? Patient is weakness in leg and is declining, hard to transfer patient

## 2023-08-22 NOTE — Progress Notes (Unsigned)
 Subjective:  Patient ID: Michele Parker, female    DOB: Sep 22, 1940  Age: 83 y.o. MRN: [id number]  No chief complaint on file.   Discussed the use of AI scribe software for clinical note transcription with the patient, who gave verbal consent to proceed.         04/28/2021   10:42 AM 11/25/2020   11:52 AM 05/07/2020   10:22 AM  Depression screen PHQ 2/9  Decreased Interest 0 0 1  Down, Depressed, Hopeless 0 0 1  PHQ - 2 Score 0 0 2  Altered sleeping   0  Tired, decreased energy   1  Change in appetite   0  Feeling bad or failure about yourself    0  Trouble concentrating   0  Moving slowly or fidgety/restless   0  Suicidal thoughts   0  PHQ-9 Score   3        04/28/2021   10:42 AM  Fall Risk   Falls in the past year? 1  Number falls in past yr: 1  Injury with Fall? 0  Follow up Falls evaluation completed    Patient Care Team: Blane Ohara, MD as PCP - General (Family Medicine) Dannielle Karvonen, MD as Referring Physician (Psychiatry) Weston Settle, MD as Consulting Physician (Oncology) Levert Feinstein, MD as Consulting Physician (Neurology) Earvin Hansen, [facility] (Inactive) as Pharmacist (Pharmacist)   Review of Systems  Constitutional:  Negative for appetite change, fatigue and fever.  HENT:  Negative for congestion, ear pain, sinus pressure and sore throat.   Respiratory:  Negative for cough, chest tightness, shortness of breath and wheezing.   Cardiovascular:  Negative for chest pain and palpitations.  Gastrointestinal:  Negative for abdominal pain, constipation, diarrhea, nausea and vomiting.  Genitourinary:  Negative for dysuria and hematuria.  Musculoskeletal:  Negative for arthralgias, back pain, joint swelling and myalgias.  Skin:  Negative for rash.  Neurological:  Negative for dizziness, weakness and headaches.  Psychiatric/Behavioral:  Negative for dysphoric mood. The patient is not nervous/anxious.     Current Outpatient Medications on File Prior to  Visit  Medication Sig Dispense Refill   acetaminophen (TYLENOL) 325 MG tablet Take 650 mg by mouth every 6 (six) hours as needed (back pain, fever of 100.4).     aspirin 81 MG chewable tablet Chew 81 mg by mouth daily.     atorvastatin (LIPITOR) 40 MG tablet Take 40 mg by mouth at bedtime.     benzonatate (TESSALON) 200 MG capsule Take 200 mg by mouth every 8 (eight) hours as needed for cough.     cetirizine (ZYRTEC) 5 MG tablet Take 5 mg by mouth at bedtime.     cholecalciferol (VITAMIN D3) 25 MCG (1000 UNIT) tablet Take 1,000 Units by mouth daily.     famotidine (PEPCID) 20 MG tablet Take 20 mg by mouth at bedtime.     FLUoxetine (PROZAC) 20 MG tablet Take 20 mg by mouth daily.     guaiFENesin (GERI-TUSSIN) 100 MG/5ML liquid Take 200 mg by mouth every 4 (four) hours as needed for cough.     hydrALAZINE (APRESOLINE) 25 MG tablet Take 25 mg by mouth 2 (two) times daily.     hydrochlorothiazide (MICROZIDE) 12.5 MG capsule Take 1 capsule (12.5 mg total) by mouth daily. 90 capsule 3   levothyroxine (SYNTHROID) 50 MCG tablet Take 50 mcg by mouth daily before breakfast.     losartan (COZAAR) 25 MG tablet Take 1 tablet (25 mg  total) by mouth daily. 90 tablet 3   nitrofurantoin, macrocrystal-monohydrate, (MACROBID) 100 MG capsule Take 100 mg by mouth daily. UTI prevention.     nystatin cream (MYCOSTATIN) Apply 1 Application topically See admin instructions. 2 entries on MAR: 1)1g topically to vaginal area and skin folds up to 4 times daily as needed for yeast infection/skin rashes 2) apply topically twice daily as needed in folds due to redness and irritation     OLANZapine (ZYPREXA) 5 MG tablet Take 5 mg by mouth at bedtime.     ondansetron (ZOFRAN) 4 MG tablet Take 4 mg by mouth every 6 (six) hours as needed for nausea or vomiting.     potassium chloride (MICRO-K) 10 MEQ CR capsule TAKE 1 CAPSULE EVERY DAY 90 capsule 1   traZODone (DESYREL) 50 MG tablet Take 50 mg by mouth at bedtime.     No  current facility-administered medications on file prior to visit.   Past Medical History:  Diagnosis Date   Allergic rhinitis 08/31/2021   Allergies 02/08/2020   Angioedema    Arthritis 06/09/2016   Bradycardia    Cervicalgia    Depression    Depression, major, recurrent, moderate (HCC) 05/11/2021   Essential hypertension    Gait abnormality 09/27/2017   GERD (gastroesophageal reflux disease) 05/11/2021   Hemochromatosis    Hemorrhoid    Hereditary hemochromatosis (HCC)    HTN (hypertension)    Hypercalcemia    Hyperlipidemia    Hypothyroid    Hypothyroidism 05/11/2021   Insomnia 08/31/2021   Lewy body dementia with behavioral disturbance (HCC) 05/11/2021   MDD (major depressive disorder), recurrent, in partial remission (HCC) 09/27/2013   PHQ- 1 (July, 2016); PHQ-9 done - 06/19/15   Memory loss    Osteopenia    Osteopenia    Other amnesia    Other constipation    Other vitamin B12 deficiency anemias 08/31/2021   Palpitations 07/20/2013   Primary hemochromatosis (HCC) 07/30/2019   PVC's (premature ventricular contractions) 06/15/2021   Repeated falls    Tremor    Vitamin D deficiency    Past Surgical History:  Procedure Laterality Date   CHOLECYSTECTOMY     KNEE SURGERY     MINOR HEMORRHOIDECTOMY     SHOULDER SURGERY     Fracture   TUBAL LIGATION      Family History  Problem Relation Age of Onset   Heart attack Mother    Heart disease Mother    Depression Mother    Stroke Mother    Other Father        unsure of medical hx - died in MVA when patient was 37 years old   CAD Brother    Alcohol abuse Brother    Hypertension Brother    Social History   Socioeconomic History   Marital status: Married    Spouse name: Not on file   Number of children: 2   Years of education: 12   Highest education level: High school graduate  Occupational History   Occupation: Retired-hosiery  Tobacco Use   Smoking status: Never   Smokeless tobacco: Never  Vaping Use    Vaping status: Never Used  Substance and Sexual Activity   Alcohol use: No   Drug use: No   Sexual activity: Not on file  Other Topics Concern   Not on file  Social History Narrative   Lives at home with her husband.   Left-handed.   2 cups caffeine per day.   Social Drivers  of Health   Financial Resource Strain: Patient Declined (09/09/2021)   Received from [facility], [facility]   Overall Financial Resource Strain (CARDIA)    Difficulty of Paying Living Expenses: Patient declined  Food Insecurity: Patient Declined (09/09/2021)   Received from [facility], [facility]   Hunger Vital Sign    Worried About Running Out of Food in the Last Year: Patient declined    Ran Out of Food in the Last Year: Patient declined  Transportation Needs: No Transportation Needs (08/30/2021)   Received from [employer], [facility]   PRAPARE - Transportation    Lack of Transportation (Medical): No    Lack of Transportation (Non-Medical): No  Physical Activity: Unknown (09/09/2021)   Received from [facility], [facility]   Exercise Vital Sign    Days of Exercise per Week: 0 days    Minutes of Exercise per Session: Not on file  Stress: Stress Concern Present (09/09/2021)   Received from [facility], [facility] - Occupational Stress Questionnaire    Feeling of Stress : Very much  Social Connections: Unknown (09/10/2022)   Received from [facility]   Social Network    Social Network: Not on file    Objective:  There were no vitals taken for this visit.     12/20/2022   10:00 PM 12/20/2022    9:40 PM 12/20/2022    6:49 PM  BP/Weight  Systolic BP 161 147   Diastolic BP 74 99   Wt. (Lbs)   161.38  BMI   27.7 kg/m2    Physical Exam  Diabetic Foot Exam - Simple   No data filed      Lab Results  Component Value Date   WBC 7.7 12/20/2022   HGB 14.0 12/20/2022   HCT 42.1 12/20/2022   PLT 205 12/20/2022   GLUCOSE 162 (H)  12/20/2022   CHOL 245 (H) 11/25/2020   TRIG 242 (H) 11/25/2020   HDL 60 11/25/2020   LDLCALC 142 (H) 11/25/2020   ALT 21 08/27/2021   AST 27 08/27/2021   NA 137 12/20/2022   K 3.8 12/20/2022   CL 100 12/20/2022   CREATININE 0.96 12/20/2022   BUN 14 12/20/2022   CO2 23 12/20/2022   TSH 3.950 04/28/2021   HGBA1C 6.0 (H) 11/25/2020      Assessment & Plan:  Assessment and Plan       There are no diagnoses linked to this encounter.   No orders of the defined types were placed in this encounter.   No orders of the defined types were placed in this encounter.    Follow-up: No follow-ups on file.   I,Lauren M Auman,acting as a Neurosurgeon for [employer], [employer].,have documented all relevant documentation on the behalf of Langley Gauss, PA,as directed by  Langley Gauss, PA while in the presence of Langley Gauss, [place].   An After Visit Summary was printed and given to the patient.  Langley Gauss, [place] [name] [phone]

## 2023-08-23 ENCOUNTER — Encounter: Payer: Self-pay | Admitting: Physician Assistant

## 2023-08-23 ENCOUNTER — Ambulatory Visit (INDEPENDENT_AMBULATORY_CARE_PROVIDER_SITE_OTHER): Payer: Self-pay | Admitting: Physician Assistant

## 2023-08-23 VITALS — BP 138/78 | HR 74 | Temp 97.1°F | Ht 64.0 in | Wt 161.0 lb

## 2023-08-23 DIAGNOSIS — E039 Hypothyroidism, unspecified: Secondary | ICD-10-CM | POA: Diagnosis not present

## 2023-08-23 DIAGNOSIS — F02818 Dementia in other diseases classified elsewhere, unspecified severity, with other behavioral disturbance: Secondary | ICD-10-CM | POA: Diagnosis not present

## 2023-08-23 DIAGNOSIS — G3183 Dementia with Lewy bodies: Secondary | ICD-10-CM

## 2023-08-23 DIAGNOSIS — M7989 Other specified soft tissue disorders: Secondary | ICD-10-CM

## 2023-08-23 DIAGNOSIS — I10 Essential (primary) hypertension: Secondary | ICD-10-CM | POA: Diagnosis not present

## 2023-08-23 DIAGNOSIS — F331 Major depressive disorder, recurrent, moderate: Secondary | ICD-10-CM

## 2023-08-23 DIAGNOSIS — E782 Mixed hyperlipidemia: Secondary | ICD-10-CM | POA: Diagnosis not present

## 2023-08-23 DIAGNOSIS — R251 Tremor, unspecified: Secondary | ICD-10-CM

## 2023-08-23 DIAGNOSIS — B3731 Acute candidiasis of vulva and vagina: Secondary | ICD-10-CM | POA: Insufficient documentation

## 2023-08-23 DIAGNOSIS — K219 Gastro-esophageal reflux disease without esophagitis: Secondary | ICD-10-CM

## 2023-08-23 DIAGNOSIS — R918 Other nonspecific abnormal finding of lung field: Secondary | ICD-10-CM | POA: Insufficient documentation

## 2023-08-23 MED ORDER — FLUCONAZOLE 150 MG PO TABS
ORAL_TABLET | ORAL | 0 refills | Status: DC
Start: 2023-08-23 — End: 2023-10-06

## 2023-08-23 MED ORDER — CEPHALEXIN 500 MG PO CAPS
500.0000 mg | ORAL_CAPSULE | Freq: Three times a day (TID) | ORAL | 0 refills | Status: DC
Start: 2023-08-23 — End: 2023-09-07

## 2023-08-23 MED ORDER — AZITHROMYCIN 500 MG PO TABS
500.0000 mg | ORAL_TABLET | Freq: Every day | ORAL | 0 refills | Status: DC
Start: 2023-08-23 — End: 2023-09-07

## 2023-08-23 NOTE — Assessment & Plan Note (Signed)
 Lewy Body Dementia with tremors and cognitive decline. Last neurology follow-up three years ago. Previous medications not well tolerated. - Review previous neurology notes and medication trials. - Consider neurology referral for reassessment and management.

## 2023-08-23 NOTE — Assessment & Plan Note (Signed)
 Increased leg swelling possibly related to heart or kidney function. Differential includes congestive heart failure or renal insufficiency. - Order labs for kidney function, electrolytes, and CBC. - Consider Lasix based on lab results. - Monitor for congestive heart failure signs.

## 2023-08-23 NOTE — Assessment & Plan Note (Signed)
 Will add lasics if lab work comes back normal

## 2023-08-23 NOTE — Assessment & Plan Note (Signed)
 Continue to monitor for worsening tremor Will adjust treatment depending on symptoms

## 2023-08-23 NOTE — Assessment & Plan Note (Signed)
 Controlled Continue to monitor symptoms

## 2023-08-23 NOTE — Assessment & Plan Note (Signed)
 Chronic cough with recent exacerbation. Differential includes pneumonia, chronic bronchitis, or occupational lung disease. Recent chest x-ray showed left lung fluid. Acid reflux may contribute. - Prescribe azithromycin 500 mg for 7 days. - Determine second antibiotic based on tolerance. - Order follow-up chest x-ray post-antibiotics to assess fluid resolution. - Consider CT scan if fluid persists. - Continue probiotic regimen to prevent C. diff. - Prescribe Diflucan for 3 days post-antibiotics to prevent yeast infection.

## 2023-08-23 NOTE — Assessment & Plan Note (Signed)
 History of yeast after antibiotics Will send diflucan for after antibiotics

## 2023-08-23 NOTE — Patient Instructions (Addendum)
 Discontinue using the Macrobid until she is finished with both the Azithromycin and Keflex. Once finished with both of those she can restart the Macrobid as prescribed.  VISIT SUMMARY:  During today's visit, we discussed your persistent cough, increased fatigue, and worsening leg swelling. We also reviewed your history of recurrent urinary tract infections, hypertension, and Lewy body dementia with tremors. A comprehensive plan was developed to address each of these issues.  YOUR PLAN:  -CHRONIC COUGH: Chronic cough can be caused by various conditions such as pneumonia, chronic bronchitis, or acid reflux. We will start you on azithromycin 500 mg for 7 days and follow up with a chest x-ray to see if the fluid in your left lung has resolved. If needed, we may consider a CT scan. Continue taking probiotics to prevent C. diff, and take Diflucan for 3 days after finishing the antibiotics to prevent a yeast infection.  -PERIPHERAL EDEMA: Peripheral edema is swelling in the lower legs, which can be related to heart or kidney function. We will order lab tests to check your kidney function, electrolytes, and complete blood count. Depending on the results, we may prescribe Lasix to help reduce the swelling. Please monitor for any signs of congestive heart failure, such as shortness of breath or difficulty breathing at night.  -RECURRENT URINARY TRACT INFECTIONS (UTIS): Recurrent UTIs are frequent bladder infections. We will hold your Macrobid while you are on the antibiotic for your cough. A urine sample will be collected to rule out a current UTI, and you can resume Macrobid after completing the antibiotic treatment.  -HYPERTENSION: Hypertension is high blood pressure. Your blood pressure is well-controlled with your current medications (losartan, hydralazine, and hydrochlorothiazide). Continue taking these medications as prescribed, and we will monitor your blood pressure to ensure it remains stable.  -LEWY  BODY DEMENTIA: Lewy Body Dementia is a type of progressive dementia that leads to a decline in thinking, reasoning, and independent function. We will review your previous neurology notes and medication trials. A referral to neurology will be considered for reassessment and management of your condition.  INSTRUCTIONS:  Please follow up with a chest x-ray after completing the antibiotic course for your cough. We will also need to collect a urine sample to check for a UTI. Depending on your lab results, we may adjust your medications. Consider scheduling an appointment with a neurologist for further evaluation of your Lewy Body Dementia.

## 2023-08-24 DIAGNOSIS — F3341 Major depressive disorder, recurrent, in partial remission: Secondary | ICD-10-CM | POA: Diagnosis not present

## 2023-08-24 DIAGNOSIS — G3183 Dementia with Lewy bodies: Secondary | ICD-10-CM | POA: Diagnosis not present

## 2023-08-24 DIAGNOSIS — E039 Hypothyroidism, unspecified: Secondary | ICD-10-CM | POA: Diagnosis not present

## 2023-08-24 DIAGNOSIS — M1990 Unspecified osteoarthritis, unspecified site: Secondary | ICD-10-CM | POA: Diagnosis not present

## 2023-08-24 DIAGNOSIS — Z792 Long term (current) use of antibiotics: Secondary | ICD-10-CM | POA: Diagnosis not present

## 2023-08-24 DIAGNOSIS — M8580 Other specified disorders of bone density and structure, unspecified site: Secondary | ICD-10-CM | POA: Diagnosis not present

## 2023-08-24 DIAGNOSIS — Z9181 History of falling: Secondary | ICD-10-CM | POA: Diagnosis not present

## 2023-08-24 DIAGNOSIS — K649 Unspecified hemorrhoids: Secondary | ICD-10-CM | POA: Diagnosis not present

## 2023-08-24 DIAGNOSIS — I493 Ventricular premature depolarization: Secondary | ICD-10-CM | POA: Diagnosis not present

## 2023-08-24 DIAGNOSIS — E559 Vitamin D deficiency, unspecified: Secondary | ICD-10-CM | POA: Diagnosis not present

## 2023-08-24 DIAGNOSIS — I10 Essential (primary) hypertension: Secondary | ICD-10-CM | POA: Diagnosis not present

## 2023-08-24 DIAGNOSIS — E782 Mixed hyperlipidemia: Secondary | ICD-10-CM | POA: Diagnosis not present

## 2023-08-24 DIAGNOSIS — F01518 Vascular dementia, unspecified severity, with other behavioral disturbance: Secondary | ICD-10-CM | POA: Diagnosis not present

## 2023-08-24 DIAGNOSIS — G4700 Insomnia, unspecified: Secondary | ICD-10-CM | POA: Diagnosis not present

## 2023-08-24 DIAGNOSIS — K219 Gastro-esophageal reflux disease without esophagitis: Secondary | ICD-10-CM | POA: Diagnosis not present

## 2023-08-24 DIAGNOSIS — F0153 Vascular dementia, unspecified severity, with mood disturbance: Secondary | ICD-10-CM | POA: Diagnosis not present

## 2023-08-24 DIAGNOSIS — Z87440 Personal history of urinary (tract) infections: Secondary | ICD-10-CM | POA: Diagnosis not present

## 2023-08-24 DIAGNOSIS — D518 Other vitamin B12 deficiency anemias: Secondary | ICD-10-CM | POA: Diagnosis not present

## 2023-08-24 DIAGNOSIS — G250 Essential tremor: Secondary | ICD-10-CM | POA: Diagnosis not present

## 2023-08-24 DIAGNOSIS — B3731 Acute candidiasis of vulva and vagina: Secondary | ICD-10-CM | POA: Diagnosis not present

## 2023-08-24 DIAGNOSIS — F0283 Dementia in other diseases classified elsewhere, unspecified severity, with mood disturbance: Secondary | ICD-10-CM | POA: Diagnosis not present

## 2023-08-24 DIAGNOSIS — Z7982 Long term (current) use of aspirin: Secondary | ICD-10-CM | POA: Diagnosis not present

## 2023-08-24 DIAGNOSIS — F02818 Dementia in other diseases classified elsewhere, unspecified severity, with other behavioral disturbance: Secondary | ICD-10-CM | POA: Diagnosis not present

## 2023-08-25 ENCOUNTER — Other Ambulatory Visit (INDEPENDENT_AMBULATORY_CARE_PROVIDER_SITE_OTHER): Payer: Self-pay

## 2023-08-25 DIAGNOSIS — G3183 Dementia with Lewy bodies: Secondary | ICD-10-CM | POA: Diagnosis not present

## 2023-08-25 DIAGNOSIS — F02818 Dementia in other diseases classified elsewhere, unspecified severity, with other behavioral disturbance: Secondary | ICD-10-CM

## 2023-08-25 LAB — LIPID PANEL
Chol/HDL Ratio: 3.9 ratio (ref 0.0–4.4)
Cholesterol, Total: 193 mg/dL (ref 100–199)
HDL: 49 mg/dL (ref >39)
LDL Chol Calc (NIH): 91 mg/dL (ref 0–99)
Triglycerides: 320 mg/dL — ABNORMAL HIGH (ref 0–149)
VLDL Cholesterol Cal: 53 mg/dL — ABNORMAL HIGH (ref 5–40)

## 2023-08-25 LAB — COMPREHENSIVE METABOLIC PANEL WITH GFR
ALT: 20 IU/L (ref 0–32)
AST: 23 IU/L (ref 0–40)
Albumin: 4.2 g/dL (ref 3.7–4.7)
Alkaline Phosphatase: 106 IU/L (ref 44–121)
BUN/Creatinine Ratio: 13 (ref 12–28)
BUN: 12 mg/dL (ref 8–27)
Bilirubin Total: 0.7 mg/dL (ref 0.0–1.2)
CO2: 25 mmol/L (ref 20–29)
Calcium: 10.4 mg/dL — ABNORMAL HIGH (ref 8.7–10.3)
Chloride: 102 mmol/L (ref 96–106)
Creatinine, Ser: 0.93 mg/dL (ref 0.57–1.00)
Globulin, Total: 2.3 g/dL (ref 1.5–4.5)
Glucose: 145 mg/dL — ABNORMAL HIGH (ref 70–99)
Potassium: 4.2 mmol/L (ref 3.5–5.2)
Sodium: 143 mmol/L (ref 134–144)
Total Protein: 6.5 g/dL (ref 6.0–8.5)
eGFR: 60 mL/min/{1.73_m2} (ref >59)

## 2023-08-25 LAB — POCT URINALYSIS DIP (CLINITEK)
Bilirubin, UA: NEGATIVE
Blood, UA: NEGATIVE
Glucose, UA: NEGATIVE mg/dL
Ketones, POC UA: NEGATIVE mg/dL
Leukocytes, UA: NEGATIVE
Nitrite, UA: NEGATIVE
POC PROTEIN,UA: NEGATIVE
Spec Grav, UA: 1.015 (ref 1.010–1.025)
Urobilinogen, UA: NEGATIVE U/dL
pH, UA: 6 (ref 5.0–8.0)

## 2023-08-25 LAB — CBC WITH DIFFERENTIAL/PLATELET

## 2023-08-25 LAB — HEMOGLOBIN A1C
Est. average glucose Bld gHb Est-mCnc: 154 mg/dL
Hgb A1c MFr Bld: 7 % — ABNORMAL HIGH (ref 4.8–5.6)

## 2023-08-25 LAB — TSH: TSH: 3.48 u[IU]/mL (ref 0.450–4.500)

## 2023-08-27 LAB — URINE CULTURE: Organism ID, Bacteria: NO GROWTH

## 2023-08-28 ENCOUNTER — Encounter: Payer: Self-pay | Admitting: Physician Assistant

## 2023-08-29 ENCOUNTER — Other Ambulatory Visit: Payer: Self-pay

## 2023-08-29 ENCOUNTER — Telehealth: Payer: Self-pay

## 2023-08-29 DIAGNOSIS — F0280 Dementia in other diseases classified elsewhere without behavioral disturbance: Secondary | ICD-10-CM | POA: Diagnosis not present

## 2023-08-29 DIAGNOSIS — M7989 Other specified soft tissue disorders: Secondary | ICD-10-CM | POA: Diagnosis not present

## 2023-08-29 DIAGNOSIS — R9431 Abnormal electrocardiogram [ECG] [EKG]: Secondary | ICD-10-CM | POA: Diagnosis not present

## 2023-08-29 DIAGNOSIS — R5383 Other fatigue: Secondary | ICD-10-CM | POA: Diagnosis not present

## 2023-08-29 DIAGNOSIS — G3183 Dementia with Lewy bodies: Secondary | ICD-10-CM | POA: Diagnosis not present

## 2023-08-29 DIAGNOSIS — Z7401 Bed confinement status: Secondary | ICD-10-CM | POA: Diagnosis not present

## 2023-08-29 DIAGNOSIS — R531 Weakness: Secondary | ICD-10-CM | POA: Diagnosis not present

## 2023-08-29 DIAGNOSIS — R4182 Altered mental status, unspecified: Secondary | ICD-10-CM | POA: Diagnosis not present

## 2023-08-29 DIAGNOSIS — R053 Chronic cough: Secondary | ICD-10-CM | POA: Diagnosis not present

## 2023-08-29 DIAGNOSIS — R001 Bradycardia, unspecified: Secondary | ICD-10-CM | POA: Diagnosis not present

## 2023-08-29 DIAGNOSIS — R609 Edema, unspecified: Secondary | ICD-10-CM | POA: Diagnosis not present

## 2023-08-29 DIAGNOSIS — I10 Essential (primary) hypertension: Secondary | ICD-10-CM | POA: Diagnosis not present

## 2023-08-29 DIAGNOSIS — R279 Unspecified lack of coordination: Secondary | ICD-10-CM | POA: Diagnosis not present

## 2023-08-29 NOTE — Telephone Encounter (Signed)
 Verbal ok for orders requested left on VM at number below.  Copied from CRM [id number]. Topic: Clinical - Home Health Verbal Orders >> Aug 29, 2023 11:52 AM DeAngela L wrote: Caller/Agency: Doug Physical therapist with [facility] home health agency  Callback Number: [phone] Service Requested: Physical Therapy Frequency: 2w3 1w5 Any new concerns about the patient? No, he would like a call back / verbal request for this treatment listed above

## 2023-08-30 ENCOUNTER — Telehealth: Payer: Self-pay

## 2023-08-30 ENCOUNTER — Encounter (HOSPITAL_BASED_OUTPATIENT_CLINIC_OR_DEPARTMENT_OTHER): Payer: Self-pay

## 2023-08-30 NOTE — Transitions of Care (Post Inpatient/ED Visit) (Signed)
 08/30/2023  Name: Michele Parker MRN: [id number] DOB: 1940-08-26  Today's TOC FU Call Status: Today's TOC FU Call Status:: Successful TOC FU Call Completed TOC FU Call Complete Date: 08/30/23 Patient's Name and Date of Birth confirmed.  Transition Care Management Follow-up Telephone Call Date of Discharge: 08/29/23 Discharge Facility: Other ([facility]) Name of Other ([facility]) Discharge Facility: [facility] Type of Discharge: Emergency Department Reason for ED Visit: Other: (fatigue, edema) How have you been since you were released from the hospital?: Better Any questions or concerns?: No  Items Reviewed: Did you receive and understand the discharge instructions provided?: Yes Medications obtained,verified, and reconciled?: Yes (Medications Reviewed) Any new allergies since your discharge?: No Dietary orders reviewed?: NA Do you have support at home?: Yes People in Home: facility resident  Medications Reviewed Today: Medications Reviewed Today     Reviewed by Karena Addison, LPN (Licensed Practical Nurse) on 08/30/23 at 1016  Med List Status: <None>   Medication Order Taking? Sig Documenting Provider Last Dose Status Informant  acetaminophen (TYLENOL) 325 MG tablet [id number] No Take 650 mg by mouth every 6 (six) hours as needed (back pain, fever of 100.4). [provider] Taking Active Nursing Home Medication Administration Guide (MAG)           Med Note (COFFELL, Marzella Schlein   Mon Dec 20, 2022  7:06 PM) PRN on [facility], no doses documented in July 2024.  aspirin 81 MG chewable tablet [id number] No Chew 81 mg by mouth daily. [provider] Taking Active Nursing Home Medication Administration Guide (MAG)  atorvastatin (LIPITOR) 40 MG tablet [id number] No Take 40 mg by mouth at bedtime. [provider] Taking Active Nursing Home Medication Administration Guide (MAG)  azithromycin (ZITHROMAX) 500 MG tablet [id number]  Take 1 tablet (500 mg total) by mouth  daily. Langley Gauss, PA  Active   benzonatate (TESSALON) 200 MG capsule [id number] No Take 200 mg by mouth every 8 (eight) hours as needed for cough. [provider] Taking Active Nursing Home Medication Administration Guide (MAG)           Med Note (COFFELL, Marzella Schlein   Mon Dec 20, 2022  7:07 PM) PRN on [facility], no doses documented in July 2024.  cephALEXin (KEFLEX) 500 MG capsule [id number]  Take 1 capsule (500 mg total) by mouth 3 (three) times daily. Langley Gauss, PA  Active   cetirizine (ZYRTEC) 5 MG tablet [id number] No Take 5 mg by mouth at bedtime. [provider] Taking Active Nursing Home Medication Administration Guide (MAG)  cholecalciferol (VITAMIN D3) 25 MCG (1000 UNIT) tablet [id number] No Take 1,000 Units by mouth daily. [provider] Taking Active Nursing Home Medication Administration Guide (MAG)  famotidine (PEPCID) 20 MG tablet [id number] No Take 20 mg by mouth at bedtime. [provider] Taking Active Nursing Home Medication Administration Guide (MAG)  fluconazole (DIFLUCAN) 150 MG tablet [id number]  Take one tablet by mouth for 3 days after finishing both antibiotics. Langley Gauss, PA  Active   FLUoxetine (PROZAC) 20 MG tablet [id number] No Take 20 mg by mouth daily. [provider] Taking Active Nursing Home Medication Administration Guide (MAG)  guaiFENesin (GERI-TUSSIN) 100 MG/5ML liquid [id number] No Take 200 mg by mouth every 4 (four) hours as needed for cough. [provider] Taking Active Nursing Home Medication Administration Guide (MAG)           Med Note (COFFELL, Marzella Schlein   Mon Dec 20, 2022  7:07 PM) PRN on MAR, no  doses documented in July 2024.  hydrALAZINE (APRESOLINE) 25 MG tablet [id number] No Take 25 mg by mouth 2 (two) times daily. [provider] Taking Active Nursing Home Medication Administration Guide (MAG)  hydrochlorothiazide (MICROZIDE) 12.5 MG capsule [id number] No Take 1 capsule (12.5 mg total) by mouth  daily. Georgeanna Lea, MD Taking Active Nursing Home Medication Administration Guide (MAG)  levothyroxine (SYNTHROID) 50 MCG tablet [id number] No Take 50 mcg by mouth daily before breakfast. [provider] Taking Active Nursing Home Medication Administration Guide (MAG)  losartan (COZAAR) 25 MG tablet [id number] No Take 1 tablet (25 mg total) by mouth daily. Georgeanna Lea, MD Taking Active Nursing Home Medication Administration Guide (MAG)  nitrofurantoin, macrocrystal-monohydrate, (MACROBID) 100 MG capsule [id number] No Take 100 mg by mouth daily. UTI prevention. [provider] Taking Active Nursing Home Medication Administration Guide (MAG)  nystatin cream (MYCOSTATIN) [id number] No Apply 1 Application topically See admin instructions. 2 entries on MAR: 1)1g topically to vaginal area and skin folds up to 4 times daily as needed for yeast infection/skin rashes 2) apply topically twice daily as needed in folds due to redness and irritation [provider] Taking Active Nursing Home Medication Administration Guide (MAG)           Med Note Precious Reel   Tue Aug 23, 2023 11:32 AM) Patient using cream only PRN redness and irritation in skin folds.  OLANZapine (ZYPREXA) 5 MG tablet [id number] No Take 5 mg by mouth at bedtime. [provider] Taking Active Nursing Home Medication Administration Guide (MAG)  ondansetron (ZOFRAN) 4 MG tablet [id number] No Take 4 mg by mouth every 6 (six) hours as needed for nausea or vomiting. [provider] Taking Active Nursing Home Medication Administration Guide (MAG)           Med Note (COFFELL, Marzella Schlein   Mon Dec 20, 2022  7:08 PM) PRN on [facility], no doses documented in July 2024.  potassium chloride (MICRO-K) 10 MEQ CR capsule [id number] No TAKE 1 CAPSULE EVERY DAY [name], [name], MD Taking Active Nursing Home Medication Administration Guide (MAG)  traZODone (DESYREL) 50 MG tablet [id number] No Take 50 mg by mouth at  bedtime. [provider] Taking Active   Med List Note Gertha Calkin, Marzella Schlein, CPhT 12/20/22 1909): [facility]  [phone]            Home Care and Equipment/Supplies: Were Home Health Services Ordered?: NA Any new equipment or medical supplies ordered?: NA  Functional Questionnaire: Do you need assistance with bathing/showering or dressing?: Yes Do you need assistance with meal preparation?: Yes Do you need assistance with eating?: No Do you have difficulty maintaining continence: Yes Do you need assistance with getting out of bed/getting out of a chair/moving?: No Do you have difficulty managing or taking your medications?: Yes  Follow up appointments reviewed: PCP Follow-up appointment confirmed?: No (sent message to staff to schedule) MD Provider Line Number:[phone] Given: No Specialist Hospital Follow-up appointment confirmed?: NA Do you need transportation to your follow-up appointment?: No Do you understand care options if your condition(s) worsen?: Yes-patient verbalized understanding    SIGNATURE Karena Addison, LPN [facility] Nurse Health Advisor Direct Dial [phone]

## 2023-08-30 NOTE — Telephone Encounter (Signed)
 Done via epic.  Copied from CRM [id number]. Topic: Medical Record Request - Other >> Aug 30, 2023 10:48 AM Almira Coaster wrote: Reason for CRM: Jeanice Lim from [facility] health is calling to request most recent office note signed by the provider. Their fax number is [phone]. Call back number [phone].

## 2023-08-31 ENCOUNTER — Other Ambulatory Visit

## 2023-09-01 ENCOUNTER — Telehealth: Payer: Self-pay

## 2023-09-01 NOTE — Telephone Encounter (Signed)
 Verbal ok given to [facility] for request below.  Copied from CRM [id number]. Topic: Clinical - Home Health Verbal Orders >> Sep 01, 2023  4:04 PM DeAngela L wrote: Caller/Agency: Doug Physical Therapist with [facility] home healthcare  Callback Number: [phone] Service Requested: Patients daughter would like to hold PT until further notice and the Physical Therapist with [facility] is calling to ask for a verbal order from Dr [name]  Any new concerns about the patient? No

## 2023-09-02 ENCOUNTER — Telehealth: Payer: Self-pay | Admitting: Family Medicine

## 2023-09-02 NOTE — Telephone Encounter (Signed)
 Michele Parker [id number]

## 2023-09-06 NOTE — Progress Notes (Signed)
 Subjective:  Patient ID: Michele Parker, female    DOB: May 31, 1941  Age: 83 y.o. MRN: [id number]  Chief Complaint  Patient presents with   Hospital follow up    Discussed the use of AI scribe software for clinical note transcription with the patient, who gave verbal consent to proceed.  Follow up Hospitalization  Patient was seen at [facility] ER on 08/29/23 and discharged on the same day. She was seen for fatigue and sleepiness.  Discussed the use of AI scribe software for clinical note transcription with the patient, who gave verbal consent to proceed.  History of Present Illness   The patient, with a history of Alzheimer's disease, presents with increased fatigue and episodes of unresponsiveness. The patient's daughter reports that these episodes are becoming more frequent and are often accompanied by uncontrollable shaking. The patient does not respond to stimuli during these episodes and appears to stare off into space. The patient also reports feeling constantly exhausted and has been sleeping more frequently. The patient's daughter notes that the patient has been less interested in participating in activities and socializing.  The patient also reports swelling in the legs. The patient's daughter is concerned about the swelling and the potential for dehydration if the patient is given medication to promote urination. The patient has a history of urinary tract infections (UTIs), but recent tests have come back clear. The patient has recently recovered from COVID-19 and reports feeling better but still a little fatigued.           08/23/2023   11:25 AM 04/28/2021   10:42 AM 11/25/2020   11:52 AM 05/07/2020   10:22 AM  Depression screen PHQ 2/9  Decreased Interest 0 0 0 1  Down, Depressed, Hopeless 0 0 0 1  PHQ - 2 Score 0 0 0 2  Altered sleeping 0   0  Tired, decreased energy 1   1  Change in appetite 0   0  Feeling bad or failure about yourself  0   0  Trouble  concentrating 0   0  Moving slowly or fidgety/restless 0   0  Suicidal thoughts 0   0  PHQ-9 Score 1   3  Difficult doing work/chores Not difficult at all           08/23/2023   11:24 AM  Fall Risk   Falls in the past year? 0  Number falls in past yr: 0  Injury with Fall? 0  Risk for fall due to : Impaired balance/gait  Follow up Falls evaluation completed    Patient Care Team: Mercy Stall, MD as PCP - General (Family Medicine) Berniece Brisk, MD as Referring Physician (Psychiatry) Deloria Fetch, MD as Consulting Physician (Oncology) Phebe Brasil, MD as Consulting Physician (Neurology) Steven Elam, [facility] (Inactive) as Pharmacist (Pharmacist)   Review of Systems  Constitutional:  Negative for appetite change, fatigue and fever.  HENT:  Negative for congestion, ear pain, sinus pressure and sore throat.   Respiratory:  Negative for cough, chest tightness, shortness of breath and wheezing.   Cardiovascular:  Negative for chest pain and palpitations.  Gastrointestinal:  Negative for abdominal pain, constipation, diarrhea, nausea and vomiting.  Genitourinary:  Negative for dysuria and hematuria.  Musculoskeletal:  Negative for arthralgias, back pain, joint swelling and myalgias.  Skin:  Negative for rash.  Neurological:  Negative for dizziness, weakness and headaches.  Psychiatric/Behavioral:  Negative for dysphoric mood. The patient is not nervous/anxious.  Current Outpatient Medications on File Prior to Visit  Medication Sig Dispense Refill   acetaminophen (TYLENOL) 325 MG tablet Take 650 mg by mouth every 6 (six) hours as needed (back pain, fever of 100.4).     aspirin 81 MG chewable tablet Chew 81 mg by mouth daily.     atorvastatin (LIPITOR) 40 MG tablet Take 40 mg by mouth at bedtime.     benzonatate (TESSALON) 200 MG capsule Take 200 mg by mouth every 8 (eight) hours as needed for cough.     cetirizine (ZYRTEC) 5 MG tablet Take 5 mg by mouth at bedtime.      cholecalciferol (VITAMIN D3) 25 MCG (1000 UNIT) tablet Take 1,000 Units by mouth daily.     famotidine (PEPCID) 20 MG tablet Take 20 mg by mouth at bedtime.     fluconazole (DIFLUCAN) 150 MG tablet Take one tablet by mouth for 3 days after finishing both antibiotics. 3 tablet 0   FLUoxetine (PROZAC) 20 MG tablet Take 20 mg by mouth daily.     guaiFENesin (GERI-TUSSIN) 100 MG/5ML liquid Take 200 mg by mouth every 4 (four) hours as needed for cough.     hydrALAZINE (APRESOLINE) 25 MG tablet Take 25 mg by mouth 2 (two) times daily.     hydrochlorothiazide (MICROZIDE) 12.5 MG capsule Take 1 capsule (12.5 mg total) by mouth daily. 90 capsule 3   levothyroxine (SYNTHROID) 50 MCG tablet Take 50 mcg by mouth daily before breakfast.     losartan (COZAAR) 25 MG tablet Take 1 tablet (25 mg total) by mouth daily. 90 tablet 3   nitrofurantoin, macrocrystal-monohydrate, (MACROBID) 100 MG capsule Take 100 mg by mouth daily. UTI prevention.     nystatin cream (MYCOSTATIN) Apply 1 Application topically See admin instructions. 2 entries on MAR: 1)1g topically to vaginal area and skin folds up to 4 times daily as needed for yeast infection/skin rashes 2) apply topically twice daily as needed in folds due to redness and irritation     OLANZapine (ZYPREXA) 5 MG tablet Take 5 mg by mouth at bedtime.     ondansetron (ZOFRAN) 4 MG tablet Take 4 mg by mouth every 6 (six) hours as needed for nausea or vomiting.     potassium chloride (MICRO-K) 10 MEQ CR capsule TAKE 1 CAPSULE EVERY DAY 90 capsule 1   traZODone (DESYREL) 50 MG tablet Take 50 mg by mouth at bedtime.     No current facility-administered medications on file prior to visit.   Past Medical History:  Diagnosis Date   Allergic rhinitis 08/31/2021   Allergies 02/08/2020   Angioedema    Arthritis 06/09/2016   Bradycardia    Cervicalgia    Depression    Depression, major, recurrent, moderate (HCC) 05/11/2021   Essential hypertension    Gait abnormality  09/27/2017   GERD (gastroesophageal reflux disease) 05/11/2021   Hemochromatosis    Hemorrhoid    Hereditary hemochromatosis (HCC)    HTN (hypertension)    Hypercalcemia    Hyperlipidemia    Hypothyroid    Hypothyroidism 05/11/2021   Insomnia 08/31/2021   Lewy body dementia with behavioral disturbance (HCC) 05/11/2021   MDD (major depressive disorder), recurrent, in partial remission (HCC) 09/27/2013   PHQ- 1 (July, 2016); PHQ-9 done - 06/19/15   Memory loss    Osteopenia    Osteopenia    Other amnesia    Other constipation    Other vitamin B12 deficiency anemias 08/31/2021   Palpitations 07/20/2013   Primary hemochromatosis (  HCC) 07/30/2019   PVC's (premature ventricular contractions) 06/15/2021   Repeated falls    Tremor    Vitamin D deficiency    Past Surgical History:  Procedure Laterality Date   CHOLECYSTECTOMY     KNEE SURGERY     MINOR HEMORRHOIDECTOMY     SHOULDER SURGERY     Fracture   TUBAL LIGATION      Family History  Problem Relation Age of Onset   Heart attack Mother    Heart disease Mother    Depression Mother    Stroke Mother    Other Father        unsure of medical hx - died in MVA when patient was 25 years old   CAD Brother    Alcohol abuse Brother    Hypertension Brother    Social History   Socioeconomic History   Marital status: Married    Spouse name: Not on file   Number of children: 2   Years of education: 12   Highest education level: High school graduate  Occupational History   Occupation: Retired-hosiery  Tobacco Use   Smoking status: Never   Smokeless tobacco: Never  Vaping Use   Vaping status: Never Used  Substance and Sexual Activity   Alcohol use: No   Drug use: No   Sexual activity: Not on file  Other Topics Concern   Not on file  Social History Narrative   Lives at home with her husband.   Left-handed.   2 cups caffeine per day.   Social Drivers of Health   Financial Resource Strain: Patient Declined (09/09/2021)    Received from [facility], [facility]   Overall Financial Resource Strain (CARDIA)    Difficulty of Paying Living Expenses: Patient declined  Food Insecurity: Patient Declined (09/09/2021)   Received from [facility], [facility]   Hunger Vital Sign    Worried About Running Out of Food in the Last Year: Patient declined    Ran Out of Food in the Last Year: Patient declined  Transportation Needs: No Transportation Needs (08/30/2021)   Received from [employer], [facility]   PRAPARE - Transportation    Lack of Transportation (Medical): No    Lack of Transportation (Non-Medical): No  Physical Activity: Unknown (09/09/2021)   Received from [facility], [facility]   Exercise Vital Sign    Days of Exercise per Week: 0 days    Minutes of Exercise per Session: Not on file  Stress: Stress Concern Present (09/09/2021)   Received from [facility], [facility] - Occupational Stress Questionnaire    Feeling of Stress : Very much  Social Connections: Unknown (09/10/2022)   Received from [facility]   Social Network    Social Network: Not on file    Objective:  BP 128/82 (BP Location: Left Arm, Patient Position: Sitting)   Pulse 67   Temp (!) 97.3 F (36.3 C) (Temporal)   Ht 5\' 4"  (1.626 m)   Wt 160 lb (72.6 kg)   SpO2 94%   BMI 27.46 kg/m      09/07/2023   11:41 AM 08/23/2023   11:25 AM 12/20/2022   10:00 PM  BP/Weight  Systolic BP 128 138 161  Diastolic BP 82 78 74  Wt. (Lbs) 160 161   BMI 27.46 kg/m2 27.64 kg/m2     Physical Exam Vitals reviewed.  Constitutional:      Appearance: Normal appearance.  Cardiovascular:  Rate and Rhythm: Normal rate and regular rhythm.     Heart sounds: Normal heart sounds.  Pulmonary:     Effort: Pulmonary effort is normal.     Breath sounds: Normal breath sounds.  Abdominal:     General: Bowel sounds are normal.     Palpations: Abdomen is soft.     Tenderness: There is no  abdominal tenderness.  Neurological:     General: No focal deficit present.     Mental Status: She is alert. Mental status is at baseline.  Psychiatric:        Mood and Affect: Mood normal.        Behavior: Behavior normal.     Diabetic Foot Exam - Simple   No data filed      Lab Results  Component Value Date   WBC CANCELED 08/23/2023   HGB CANCELED 08/23/2023   HCT CANCELED 08/23/2023   PLT CANCELED 08/23/2023   GLUCOSE 145 (H) 08/23/2023   CHOL 193 08/23/2023   TRIG 320 (H) 08/23/2023   HDL 49 08/23/2023   LDLCALC 91 08/23/2023   ALT 20 08/23/2023   AST 23 08/23/2023   NA 143 08/23/2023   K 4.2 08/23/2023   CL 102 08/23/2023   CREATININE 0.93 08/23/2023   BUN 12 08/23/2023   CO2 25 08/23/2023   TSH 3.480 08/23/2023   HGBA1C 7.0 (H) 08/23/2023      Assessment & Plan:    Frequent urination Assessment & Plan: Frequent UTIs with recent non-sterile sample showing possible infection. Need for clean sample to confirm diagnosis. - Obtain a clean urine sample for urinalysis to rule out UTI. - Monitor for symptoms of UTI and treat if necessary.  Orders: -     POCT URINALYSIS DIP (CLINITEK)  Acute cystitis without hematuria Assessment & Plan: Frequent UTIs with recent non-sterile sample showing possible infection. Need for clean sample to confirm diagnosis. - Obtain a clean urine sample for urinalysis to rule out UTI. - Monitor for symptoms of UTI and treat if necessary.  Orders: -     Urine Culture  Tremor Assessment & Plan: Worsening symptoms Comes and goes with no indication as to what causes it to be worse or better Continue to monitor  Orders: -     Ambulatory referral to Neurology  Lewy body dementia with behavioral disturbance (HCC) Assessment & Plan: Stable with potential worsening depression Continue to monitor Will send referral to neurology for closer monitoring  Orders: -     Ambulatory referral to Neurology  Episode of  unresponsiveness Assessment & Plan: Episodes suggestive of absence seizures, increased frequency without UTI association. Neurological evaluation needed for diagnosis confirmation and treatment guidance. - Refer to neurology for evaluation of possible absence seizures. - Monitor for signs of absence seizures, including eye twitching during episodes.   Insomnia due to other mental disorder Assessment & Plan: Fatigue and sleepiness likely exacerbated by depression. Emphasized self-management and activity engagement to mitigate symptoms. - Encourage participation in social activities, such as bingo on Fridays. - Encourage outdoor walks on Wednesdays for at least 30 minutes. - Monitor for signs of depression and encourage self-effort in managing symptoms.   Leg swelling Assessment & Plan: Leg swelling possibly due to fluid retention. Discussed compression socks for management, considering her sensitivity to touch. - Recommend use of compression socks to manage leg swelling. - Monitor for changes in swelling and adjust treatment if necessary.      No orders of the defined types were  placed in this encounter.   Orders Placed This Encounter  Procedures   Urine Culture   Ambulatory referral to Neurology   POCT URINALYSIS DIP (CLINITEK)    General Health Maintenance Encouraged participation in social and physical activities to enhance well-being and prevent isolation. - Encourage participation in social activities and outdoor walks. - Promote engagement with friends and participation in facility events.  Follow-up Plan to evaluate progress with activities, urinalysis, and neurological evaluation results. - Schedule follow-up appointment in one month to assess progress and review test results.        Follow-up: Return in about 4 weeks (around 10/05/2023).   I,Lauren M Auman,acting as a Neurosurgeon for [employer], [employer].,have documented all relevant documentation on the behalf of Odilia Bennett, PA,as directed by  Odilia Bennett, PA while in the presence of Odilia Bennett, [place].   An After Visit Summary was printed and given to the patient.  Odilia Bennett, [place] [name] [phone]

## 2023-09-07 ENCOUNTER — Ambulatory Visit: Admitting: Physician Assistant

## 2023-09-07 ENCOUNTER — Encounter: Payer: Self-pay | Admitting: Physician Assistant

## 2023-09-07 VITALS — BP 128/82 | HR 67 | Temp 97.3°F | Ht 64.0 in | Wt 160.0 lb

## 2023-09-07 DIAGNOSIS — R404 Transient alteration of awareness: Secondary | ICD-10-CM

## 2023-09-07 DIAGNOSIS — G3183 Dementia with Lewy bodies: Secondary | ICD-10-CM

## 2023-09-07 DIAGNOSIS — N3000 Acute cystitis without hematuria: Secondary | ICD-10-CM | POA: Diagnosis not present

## 2023-09-07 DIAGNOSIS — M7989 Other specified soft tissue disorders: Secondary | ICD-10-CM

## 2023-09-07 DIAGNOSIS — R251 Tremor, unspecified: Secondary | ICD-10-CM | POA: Diagnosis not present

## 2023-09-07 DIAGNOSIS — R350 Frequency of micturition: Secondary | ICD-10-CM

## 2023-09-07 DIAGNOSIS — F5105 Insomnia due to other mental disorder: Secondary | ICD-10-CM

## 2023-09-07 DIAGNOSIS — F02818 Dementia in other diseases classified elsewhere, unspecified severity, with other behavioral disturbance: Secondary | ICD-10-CM

## 2023-09-07 DIAGNOSIS — F99 Mental disorder, not otherwise specified: Secondary | ICD-10-CM

## 2023-09-07 LAB — POCT URINALYSIS DIP (CLINITEK)
Bilirubin, UA: NEGATIVE
Blood, UA: NEGATIVE
Glucose, UA: NEGATIVE mg/dL
Ketones, POC UA: NEGATIVE mg/dL
Nitrite, UA: NEGATIVE
POC PROTEIN,UA: NEGATIVE
Spec Grav, UA: 1.015 (ref 1.010–1.025)
Urobilinogen, UA: NEGATIVE U/dL
pH, UA: 6.5 (ref 5.0–8.0)

## 2023-09-07 NOTE — Patient Instructions (Signed)
 VISIT SUMMARY:  During today's visit, we discussed your increased fatigue, episodes of unresponsiveness, leg swelling, and general health maintenance. We also reviewed your recent recovery from COVID-19 and your history of urinary tract infections. Your daughter provided valuable insights into your symptoms and concerns.  YOUR PLAN:  -EPISODES OF UNRESPONSIVENESS: These episodes may be absence seizures, which are brief periods where you seem to 'zone out' and are unresponsive. We will refer you to a neurologist for further evaluation and guidance on treatment. Please monitor for signs such as eye twitching during these episodes.  -FATIGUE AND EXCESSIVE SLEEPINESS: Your fatigue and sleepiness may be worsened by depression. It's important to stay active and engaged in social activities to help manage these symptoms. Try to participate in social activities like bingo on Fridays and take outdoor walks on Wednesdays for at least 30 minutes.  -SWELLING IN LEGS: The swelling in your legs might be due to fluid retention. We recommend using compression socks to help manage this swelling. Please monitor for any changes and let us know if the swelling persists or worsens.  -URINARY TRACT INFECTION (UTI): You have a history of frequent UTIs, but recent tests were not conclusive. We need a clean urine sample to confirm if there is an infection. Please monitor for any symptoms of a UTI and inform us if they occur.  -GENERAL HEALTH MAINTENANCE: Staying active and socially engaged is important for your overall well-being. We encourage you to participate in social activities and outdoor walks to enhance your health and prevent feelings of isolation.  INSTRUCTIONS:  Please schedule a follow-up appointment in one month to assess your progress and review the results of your urinalysis and neurological evaluation.

## 2023-09-08 ENCOUNTER — Telehealth: Payer: Self-pay | Admitting: Family Medicine

## 2023-09-08 LAB — URINE CULTURE

## 2023-09-08 NOTE — Telephone Encounter (Signed)
 [facility] [phone]

## 2023-09-12 DIAGNOSIS — R350 Frequency of micturition: Secondary | ICD-10-CM | POA: Insufficient documentation

## 2023-09-12 DIAGNOSIS — N3000 Acute cystitis without hematuria: Secondary | ICD-10-CM | POA: Insufficient documentation

## 2023-09-12 DIAGNOSIS — R404 Transient alteration of awareness: Secondary | ICD-10-CM | POA: Insufficient documentation

## 2023-09-12 NOTE — Assessment & Plan Note (Signed)
 Episodes suggestive of absence seizures, increased frequency without UTI association. Neurological evaluation needed for diagnosis confirmation and treatment guidance. - Refer to neurology for evaluation of possible absence seizures. - Monitor for signs of absence seizures, including eye twitching during episodes.

## 2023-09-12 NOTE — Assessment & Plan Note (Signed)
 Leg swelling possibly due to fluid retention. Discussed compression socks for management, considering her sensitivity to touch. - Recommend use of compression socks to manage leg swelling. - Monitor for changes in swelling and adjust treatment if necessary.

## 2023-09-12 NOTE — Assessment & Plan Note (Signed)
 Stable with potential worsening depression Continue to monitor Will send referral to neurology for closer monitoring

## 2023-09-12 NOTE — Assessment & Plan Note (Signed)
 Frequent UTIs with recent non-sterile sample showing possible infection. Need for clean sample to confirm diagnosis. - Obtain a clean urine sample for urinalysis to rule out UTI. - Monitor for symptoms of UTI and treat if necessary.

## 2023-09-12 NOTE — Assessment & Plan Note (Signed)
 Worsening symptoms Comes and goes with no indication as to what causes it to be worse or better Continue to monitor

## 2023-09-12 NOTE — Assessment & Plan Note (Signed)
 Fatigue and sleepiness likely exacerbated by depression. Emphasized self-management and activity engagement to mitigate symptoms. - Encourage participation in social activities, such as bingo on Fridays. - Encourage outdoor walks on Wednesdays for at least 30 minutes. - Monitor for signs of depression and encourage self-effort in managing symptoms.

## 2023-09-13 ENCOUNTER — Encounter: Payer: Self-pay | Admitting: Neurology

## 2023-09-13 ENCOUNTER — Encounter: Payer: Self-pay | Admitting: Physician Assistant

## 2023-09-16 ENCOUNTER — Ambulatory Visit: Payer: Medicare HMO | Admitting: Family Medicine

## 2023-09-16 ENCOUNTER — Ambulatory Visit: Admitting: Family Medicine

## 2023-09-16 DIAGNOSIS — R440 Auditory hallucinations: Secondary | ICD-10-CM | POA: Diagnosis not present

## 2023-09-16 DIAGNOSIS — F330 Major depressive disorder, recurrent, mild: Secondary | ICD-10-CM | POA: Diagnosis not present

## 2023-09-16 DIAGNOSIS — G4709 Other insomnia: Secondary | ICD-10-CM | POA: Diagnosis not present

## 2023-10-06 ENCOUNTER — Ambulatory Visit: Admitting: Physician Assistant

## 2023-10-06 ENCOUNTER — Encounter: Payer: Self-pay | Admitting: Family Medicine

## 2023-10-06 ENCOUNTER — Ambulatory Visit (INDEPENDENT_AMBULATORY_CARE_PROVIDER_SITE_OTHER): Admitting: Family Medicine

## 2023-10-06 VITALS — BP 120/68 | HR 66 | Temp 97.2°F | Resp 16 | Ht 64.0 in | Wt 155.0 lb

## 2023-10-06 DIAGNOSIS — E039 Hypothyroidism, unspecified: Secondary | ICD-10-CM

## 2023-10-06 DIAGNOSIS — G3183 Dementia with Lewy bodies: Secondary | ICD-10-CM

## 2023-10-06 DIAGNOSIS — F02818 Dementia in other diseases classified elsewhere, unspecified severity, with other behavioral disturbance: Secondary | ICD-10-CM

## 2023-10-06 DIAGNOSIS — I10 Essential (primary) hypertension: Secondary | ICD-10-CM | POA: Diagnosis not present

## 2023-10-06 DIAGNOSIS — E782 Mixed hyperlipidemia: Secondary | ICD-10-CM | POA: Diagnosis not present

## 2023-10-06 NOTE — Assessment & Plan Note (Signed)
Continue levothryoxine 50 mcg one in am.  ?

## 2023-10-06 NOTE — Assessment & Plan Note (Signed)
 Stable with potential worsening depression Continue to monitor Referral to neurologist will be cancelled.  Patient is in hospice

## 2023-10-06 NOTE — Assessment & Plan Note (Addendum)
 Not treating due to advanced dementia.

## 2023-10-06 NOTE — Progress Notes (Signed)
 Subjective:  Patient ID: Michele Parker, female    DOB: [birth year]  Age: 83 y.o. MRN: [id number]  Chief Complaint  Patient presents with   Dementia   Low oxygen    HPI: Patient is an 83 year old white female with Lewy body disease who presents for chronic follow-up.  Recently she was entered into hospice care.  She is currently living at [facility] which is an assisted living.  Per her last note on April 9 she was feeling exhausted I have been sleeping a great deal.  She had swelling in her legs.  She has a history of urinary tract infections.  She was referred to neurology last month for the Lewy body disease as well as she was having episodes of unresponsiveness which were concerning for possible absence seizureS.  Patient has an appoint with Dr. Ty Gales on November 24, 2023.Aaron Aas   For the swelling in her legs compression socks were recommended. Hospice started her on Lasix 40 mg daily.   Diabetes: Last A1c was 7.  He is currently on no diabetes medicine which is appropriate.  GERD: On Pepcid 20 mg nightly Hyperlipidemia: atorvastatin 40 mg nightly was stopped.  Depression/anxiety: Currently on Prozac 20 mg daily, olanzapine 5 mg daily, trazodone 50 mg nightly.  Hypertension: Currently on hydralazine 25 mg twice daily, losartan  25 mg once daily, and on aspirin 81 mg once daily. Monitoring bp and tried to wean off hydralazine, but bp went back up.   Hypothyroidism: Currently on levothyroxine  50 mcg daily in AM.  TSH was last done March 25 and was therapeutic at 3.480.  Chronic urinary tract infections: Currently on nitrofurantoin  100 mg once daily for prophylaxis.  Pedal edema: lasix 40 mg daily.   Megan, RN, is caring for her. Dr. Rheba Cedar     08/23/2023   11:25 AM 04/28/2021   10:42 AM 11/25/2020   11:52 AM 05/07/2020   10:22 AM  Depression screen PHQ 2/9  Decreased Interest 0 0 0 1  Down, Depressed, Hopeless 0 0 0 1  PHQ - 2 Score 0 0 0 2  Altered sleeping 0   0  Tired,  decreased energy 1   1  Change in appetite 0   0  Feeling bad or failure about yourself  0   0  Trouble concentrating 0   0  Moving slowly or fidgety/restless 0   0  Suicidal thoughts 0   0  PHQ-9 Score 1   3  Difficult doing work/chores Not difficult at all           08/23/2023   11:24 AM  Fall Risk   Falls in the past year? 0  Number falls in past yr: 0  Injury with Fall? 0  Risk for fall due to : Impaired balance/gait  Follow up Falls evaluation completed    Patient Care Team: Mercy Stall, MD as PCP - General (Family Medicine) Berniece Brisk, MD as Referring Physician (Psychiatry) Deloria Fetch, MD as Consulting Physician (Oncology) Phebe Brasil, MD as Consulting Physician (Neurology) Steven Elam, [facility] (Inactive) as Pharmacist (Pharmacist)   Review of Systems  Constitutional:  Negative for chills, fatigue and fever.  HENT:  Negative for congestion, ear pain and sore throat.   Respiratory:  Positive for shortness of breath. Negative for cough.   Cardiovascular:  Negative for chest pain and palpitations.  Gastrointestinal:  Negative for abdominal pain, constipation, diarrhea, nausea and vomiting.  Endocrine: Negative for polydipsia, polyphagia and polyuria.  Genitourinary:  Negative for difficulty urinating and dysuria.  Musculoskeletal:  Negative for arthralgias, back pain and myalgias.  Skin:  Negative for rash.  Neurological:  Negative for headaches.  Psychiatric/Behavioral:  Negative for dysphoric mood. The patient is not nervous/anxious.     Current Outpatient Medications on File Prior to Visit  Medication Sig Dispense Refill   acetaminophen (TYLENOL) 325 MG tablet Take 650 mg by mouth every 6 (six) hours as needed (back pain, fever of 100.4).     benzonatate  (TESSALON ) 200 MG capsule Take 200 mg by mouth every 8 (eight) hours as needed for cough.     bisacodyl (DULCOLAX) 10 MG suppository Place rectally.     estradiol (ESTRACE) 0.1 MG/GM vaginal cream Place  vaginally.     famotidine (PEPCID) 20 MG tablet Take 20 mg by mouth at bedtime.     FLUoxetine (PROZAC) 20 MG tablet Take 20 mg by mouth daily.     hydrALAZINE (APRESOLINE) 25 MG tablet Take 25 mg by mouth 2 (two) times daily.     LASIX 40 MG tablet Take by mouth.     levothyroxine  (SYNTHROID ) 50 MCG tablet Take 50 mcg by mouth daily before breakfast.     losartan  (COZAAR ) 25 MG tablet Take 1 tablet (25 mg total) by mouth daily. 90 tablet 3   Morphine Sulfate (MORPHINE CONCENTRATE) 10 mg / 0.5 ml concentrated solution      nitrofurantoin , macrocrystal-monohydrate, (MACROBID ) 100 MG capsule Take 100 mg by mouth daily. UTI prevention.     nystatin cream (MYCOSTATIN) Apply 1 Application topically See admin instructions. 2 entries on MAR: 1)1g topically to vaginal area and skin folds up to 4 times daily as needed for yeast infection/skin rashes 2) apply topically twice daily as needed in folds due to redness and irritation     OLANZapine (ZYPREXA) 5 MG tablet Take 5 mg by mouth at bedtime.     ondansetron (ZOFRAN) 4 MG tablet Take 4 mg by mouth every 6 (six) hours as needed for nausea or vomiting.     potassium chloride  (MICRO-K ) 10 MEQ CR capsule TAKE 1 CAPSULE EVERY DAY 90 capsule 1   traZODone (DESYREL) 50 MG tablet Take 50 mg by mouth at bedtime.     No current facility-administered medications on file prior to visit.   Past Medical History:  Diagnosis Date   Allergic rhinitis 08/31/2021   Allergies 02/08/2020   Angioedema    Arthritis 06/09/2016   Bradycardia    Cervicalgia    Depression    Depression, major, recurrent, moderate (HCC) 05/11/2021   Essential hypertension    Gait abnormality 09/27/2017   GERD (gastroesophageal reflux disease) 05/11/2021   Hemochromatosis    Hemorrhoid    Hereditary hemochromatosis (HCC)    HTN (hypertension)    Hypercalcemia    Hyperlipidemia    Hypothyroid    Hypothyroidism 05/11/2021   Insomnia 08/31/2021   Lewy body dementia with behavioral  disturbance (HCC) 05/11/2021   MDD (major depressive disorder), recurrent, in partial remission (HCC) 09/27/2013   PHQ- 1 (July, 2016); PHQ-9 done - 06/19/15   Memory loss    Osteopenia    Osteopenia    Other amnesia    Other constipation    Other vitamin B12 deficiency anemias 08/31/2021   Palpitations 07/20/2013   Primary hemochromatosis (HCC) 07/30/2019   PVC's (premature ventricular contractions) 06/15/2021   Repeated falls    Tremor    Vitamin D deficiency    Past Surgical History:  Procedure Laterality Date  CHOLECYSTECTOMY     KNEE SURGERY     MINOR HEMORRHOIDECTOMY     SHOULDER SURGERY     Fracture   TUBAL LIGATION      Family History  Problem Relation Age of Onset   Heart attack Mother    Heart disease Mother    Depression Mother    Stroke Mother    Other Father        unsure of medical hx - died in MVA when patient was 72 years old   CAD Brother    Alcohol abuse Brother    Hypertension Brother    Social History   Socioeconomic History   Marital status: Married    Spouse name: Not on file   Number of children: 2   Years of education: 12   Highest education level: High school graduate  Occupational History   Occupation: Retired-hosiery  Tobacco Use   Smoking status: Never   Smokeless tobacco: Never  Vaping Use   Vaping status: Never Used  Substance and Sexual Activity   Alcohol use: No   Drug use: No   Sexual activity: Not on file  Other Topics Concern   Not on file  Social History Narrative   Lives at home with her husband.   Left-handed.   2 cups caffeine per day.   Social Drivers of Health   Financial Resource Strain: Patient Declined (09/09/2021)   Received from [facility], [facility]   Overall Financial Resource Strain (CARDIA)    Difficulty of Paying Living Expenses: Patient declined  Food Insecurity: Patient Declined (09/09/2021)   Received from [facility], [facility]   Hunger Vital Sign    Worried About Running Out of  Food in the Last Year: Patient declined    Ran Out of Food in the Last Year: Patient declined  Transportation Needs: No Transportation Needs (08/30/2021)   Received from [employer], [facility]   PRAPARE - Transportation    Lack of Transportation (Medical): No    Lack of Transportation (Non-Medical): No  Physical Activity: Unknown (09/09/2021)   Received from [facility], [facility]   Exercise Vital Sign    Days of Exercise per Week: 0 days    Minutes of Exercise per Session: Not on file  Stress: Stress Concern Present (09/09/2021)   Received from [facility], [facility] of [facility]ational Health - Occupational Stress Questionnaire    Feeling of Stress : Very much  Social Connections: Unknown (09/10/2022)   Received from [facility]   Social Network    Social Network: Not on file    Objective:  BP 120/68   Pulse 66   Temp (!) 97.2 F (36.2 C)   Resp 16   Ht 5\' 4"  (1.626 m)   Wt 155 lb (70.3 kg)   SpO2 92% Comment: 3L oxygen  BMI 26.61 kg/m      10/06/2023   11:05 AM 09/07/2023   11:41 AM 08/23/2023   11:25 AM  BP/Weight  Systolic BP 120 128 138  Diastolic BP 68 82 78  Wt. (Lbs) 155 160 161  BMI 26.61 kg/m2 27.46 kg/m2 27.64 kg/m2    Physical Exam Vitals reviewed.  Constitutional:      Comments: Patient is sitting in wheel chair. Falling asleep during appointment.   Neck:     Vascular: No carotid bruit.  Cardiovascular:     Rate and Rhythm: Normal rate and regular rhythm.     Heart sounds: Normal heart  sounds.  Pulmonary:     Effort: Pulmonary effort is normal. No respiratory distress.     Breath sounds: Normal breath sounds.  Abdominal:     General: Abdomen is flat. Bowel sounds are normal.     Palpations: Abdomen is soft.     Tenderness: There is no abdominal tenderness.  Musculoskeletal:     Right lower leg: Edema present.     Left lower leg: Edema present.  Neurological:     Mental Status: She is alert and oriented to  person, place, and time.  Psychiatric:        Mood and Affect: Mood normal.        Behavior: Behavior normal.     Diabetic Foot Exam - Simple   No data filed      Lab Results  Component Value Date   WBC CANCELED 08/23/2023   HGB CANCELED 08/23/2023   HCT CANCELED 08/23/2023   PLT CANCELED 08/23/2023   GLUCOSE 145 (H) 08/23/2023   CHOL 193 08/23/2023   TRIG 320 (H) 08/23/2023   HDL 49 08/23/2023   LDLCALC 91 08/23/2023   ALT 20 08/23/2023   AST 23 08/23/2023   NA 143 08/23/2023   K 4.2 08/23/2023   CL 102 08/23/2023   CREATININE 0.93 08/23/2023   BUN 12 08/23/2023   CO2 25 08/23/2023   TSH 3.480 08/23/2023   HGBA1C 7.0 (H) 08/23/2023      Assessment & Plan:  Lewy body dementia with behavioral disturbance (HCC) Assessment & Plan: Stable with potential worsening depression Continue to monitor Referral to neurologist will be cancelled.  Patient is in hospice   Primary hypertension Assessment & Plan: Well controlled.  No changes to medicines.  Currently on hydralazine 25 mg twice daily, losartan  25 mg once daily, and on aspirin 81 mg once daily.  Continue to work on eating a healthy diet and exercise.     Mixed hyperlipidemia Assessment & Plan: Not treating due to advanced dementia.    Acquired hypothyroidism Assessment & Plan: Continue levothryoxine 50 mcg one in am.       No orders of the defined types were placed in this encounter.   No orders of the defined types were placed in this encounter.    Follow-up: Return if symptoms worsen or fail to improve.   I,Marla I Leal-Borjas,acting as a scribe for Mercy Stall, MD.,have documented all relevant documentation on the behalf of Mercy Stall, MD,as directed by  Mercy Stall, MD while in the presence of Mercy Stall, MD.   An After Visit Summary was printed and given to the patient.  I attest that I have reviewed this visit and agree with the plan scribed by my staff.   Mercy Stall, MD [facility] [phone]

## 2023-10-06 NOTE — Assessment & Plan Note (Signed)
 Well controlled.  No changes to medicines.  Currently on hydralazine 25 mg twice daily, losartan  25 mg once daily, and on aspirin 81 mg once daily.  Continue to work on eating a healthy diet and exercise.

## 2023-10-06 NOTE — Progress Notes (Deleted)
 Subjective:  Patient ID: Michele Parker, female    DOB: February 19, 1941  Age: 83 y.o. MRN: [id number]  Chief Complaint  Patient presents with   Dementia   Low oxygen    HPI: Patient is an 83 year old white female with Lewy body disease who presents for chronic follow-up.  Recently she was entered into hospice care.  She is currently living at [facility] which is an assisted living.  Per her last note on April 9 she was feeling exhausted I have been sleeping a great deal.  She had swelling in her legs.  She has a history of urinary tract infections.  She was referred to neurology last month for the Lewy body disease as well as she was having episodes of unresponsiveness which were concerning for possible absence seizureS.  Patient has an appoint with Dr. Ty Gales on November 24, 2023.Aaron Aas   For the swelling in her legs compression socks were recommended.  Diabetes: Last A1c was 7.  He is currently on no diabetes medicine which is appropriate.  GERD: On Pepcid 20 mg nightly Hyperlipidemia: A atorvastatin 40 mg nightly.  Depression/anxiety: Currently on Prozac 20 mg daily, olanzapine 5 mg daily, trazodone 50 mg nightly.  Hypertension: Currently on hydrochlorothiazide  12 and half milligrams daily, hydralazine 25 mg twice daily losartan  25 mg once daily, and on aspirin 81 mg once daily.  Hypothyroidism: Currently on levothyroxine  50 mcg daily in AM.  TSH was last done March 25 and was therapeutic at 3.480.  Chronic urinary tract infections: Currently on nitrofurantoin  100 mg once daily for prophylaxis.      08/23/2023   11:25 AM 04/28/2021   10:42 AM 11/25/2020   11:52 AM 05/07/2020   10:22 AM  Depression screen PHQ 2/9  Decreased Interest 0 0 0 1  Down, Depressed, Hopeless 0 0 0 1  PHQ - 2 Score 0 0 0 2  Altered sleeping 0   0  Tired, decreased energy 1   1  Change in appetite 0   0  Feeling bad or failure about yourself  0   0  Trouble concentrating 0   0  Moving slowly or fidgety/restless 0    0  Suicidal thoughts 0   0  PHQ-9 Score 1   3  Difficult doing work/chores Not difficult at all           08/23/2023   11:24 AM  Fall Risk   Falls in the past year? 0  Number falls in past yr: 0  Injury with Fall? 0  Risk for fall due to : Impaired balance/gait  Follow up Falls evaluation completed    Patient Care Team: Mercy Stall, MD as PCP - General (Family Medicine) Berniece Brisk, MD as Referring Physician (Psychiatry) Deloria Fetch, MD as Consulting Physician (Oncology) Phebe Brasil, MD as Consulting Physician (Neurology) Steven Elam, [facility] (Inactive) as Pharmacist (Pharmacist)   Review of Systems  Current Outpatient Medications on File Prior to Visit  Medication Sig Dispense Refill   acetaminophen (TYLENOL) 325 MG tablet Take 650 mg by mouth every 6 (six) hours as needed (back pain, fever of 100.4).     aspirin 81 MG chewable tablet Chew 81 mg by mouth daily.     atorvastatin (LIPITOR) 40 MG tablet Take 40 mg by mouth at bedtime.     benzonatate  (TESSALON ) 200 MG capsule Take 200 mg by mouth every 8 (eight) hours as needed for cough.     cetirizine (ZYRTEC) 5 MG  tablet Take 5 mg by mouth at bedtime.     cholecalciferol (VITAMIN D3) 25 MCG (1000 UNIT) tablet Take 1,000 Units by mouth daily.     famotidine (PEPCID) 20 MG tablet Take 20 mg by mouth at bedtime.     fluconazole  (DIFLUCAN ) 150 MG tablet Take one tablet by mouth for 3 days after finishing both antibiotics. 3 tablet 0   FLUoxetine (PROZAC) 20 MG tablet Take 20 mg by mouth daily.     guaiFENesin (GERI-TUSSIN) 100 MG/5ML liquid Take 200 mg by mouth every 4 (four) hours as needed for cough.     hydrALAZINE (APRESOLINE) 25 MG tablet Take 25 mg by mouth 2 (two) times daily.     hydrochlorothiazide  (MICROZIDE ) 12.5 MG capsule Take 1 capsule (12.5 mg total) by mouth daily. 90 capsule 3   levothyroxine  (SYNTHROID ) 50 MCG tablet Take 50 mcg by mouth daily before breakfast.     losartan  (COZAAR ) 25 MG tablet Take 1  tablet (25 mg total) by mouth daily. 90 tablet 3   nitrofurantoin , macrocrystal-monohydrate, (MACROBID ) 100 MG capsule Take 100 mg by mouth daily. UTI prevention.     nystatin cream (MYCOSTATIN) Apply 1 Application topically See admin instructions. 2 entries on MAR: 1)1g topically to vaginal area and skin folds up to 4 times daily as needed for yeast infection/skin rashes 2) apply topically twice daily as needed in folds due to redness and irritation     OLANZapine (ZYPREXA) 5 MG tablet Take 5 mg by mouth at bedtime.     ondansetron (ZOFRAN) 4 MG tablet Take 4 mg by mouth every 6 (six) hours as needed for nausea or vomiting.     potassium chloride  (MICRO-K ) 10 MEQ CR capsule TAKE 1 CAPSULE EVERY DAY 90 capsule 1   traZODone (DESYREL) 50 MG tablet Take 50 mg by mouth at bedtime.     No current facility-administered medications on file prior to visit.   Past Medical History:  Diagnosis Date   Allergic rhinitis 08/31/2021   Allergies 02/08/2020   Angioedema    Arthritis 06/09/2016   Bradycardia    Cervicalgia    Depression    Depression, major, recurrent, moderate (HCC) 05/11/2021   Essential hypertension    Gait abnormality 09/27/2017   GERD (gastroesophageal reflux disease) 05/11/2021   Hemochromatosis    Hemorrhoid    Hereditary hemochromatosis (HCC)    HTN (hypertension)    Hypercalcemia    Hyperlipidemia    Hypothyroid    Hypothyroidism 05/11/2021   Insomnia 08/31/2021   Lewy body dementia with behavioral disturbance (HCC) 05/11/2021   MDD (major depressive disorder), recurrent, in partial remission (HCC) 09/27/2013   PHQ- 1 (July, 2016); PHQ-9 done - 06/19/15   Memory loss    Osteopenia    Osteopenia    Other amnesia    Other constipation    Other vitamin B12 deficiency anemias 08/31/2021   Palpitations 07/20/2013   Primary hemochromatosis (HCC) 07/30/2019   PVC's (premature ventricular contractions) 06/15/2021   Repeated falls    Tremor    Vitamin D deficiency     Past Surgical History:  Procedure Laterality Date   CHOLECYSTECTOMY     KNEE SURGERY     MINOR HEMORRHOIDECTOMY     SHOULDER SURGERY     Fracture   TUBAL LIGATION      Family History  Problem Relation Age of Onset   Heart attack Mother    Heart disease Mother    Depression Mother    Stroke Mother  Other Father        unsure of medical hx - died in MVA when patient was 55 years old   CAD Brother    Alcohol abuse Brother    Hypertension Brother    Social History   Socioeconomic History   Marital status: Married    Spouse name: Not on file   Number of children: 2   Years of education: 12   Highest education level: High school graduate  Occupational History   Occupation: Retired-hosiery  Tobacco Use   Smoking status: Never   Smokeless tobacco: Never  Vaping Use   Vaping status: Never Used  Substance and Sexual Activity   Alcohol use: No   Drug use: No   Sexual activity: Not on file  Other Topics Concern   Not on file  Social History Narrative   Lives at home with her husband.   Left-handed.   2 cups caffeine per day.   Social Drivers of Health   Financial Resource Strain: Patient Declined (09/09/2021)   Received from [facility], [facility]   Overall Financial Resource Strain (CARDIA)    Difficulty of Paying Living Expenses: Patient declined  Food Insecurity: Patient Declined (09/09/2021)   Received from [facility], [facility]   Hunger Vital Sign    Worried About Running Out of Food in the Last Year: Patient declined    Ran Out of Food in the Last Year: Patient declined  Transportation Needs: No Transportation Needs (08/30/2021)   Received from [employer], [facility]   PRAPARE - Transportation    Lack of Transportation (Medical): No    Lack of Transportation (Non-Medical): No  Physical Activity: Unknown (09/09/2021)   Received from [facility], [facility]   Exercise Vital Sign    Days of Exercise per Week: 0 days    Minutes of  Exercise per Session: Not on file  Stress: Stress Concern Present (09/09/2021)   Received from [facility], [facility] of Occupational Health - Occupational Stress Questionnaire    Feeling of Stress : Very much  Social Connections: Unknown (09/10/2022)   Received from [facility]   Social Network    Social Network: Not on file    Objective:  BP 120/68   Pulse 66   Temp (!) 97.2 F (36.2 C)   Resp 16   Ht 5\' 4"  (1.626 m)   Wt 155 lb (70.3 kg)   SpO2 92% Comment: 3L oxygen  BMI 26.61 kg/m      10/06/2023   11:05 AM 09/07/2023   11:41 AM 08/23/2023   11:25 AM  BP/Weight  Systolic BP 120 128 138  Diastolic BP 68 82 78  Wt. (Lbs) 155 160 161  BMI 26.61 kg/m2 27.46 kg/m2 27.64 kg/m2    Physical Exam  Diabetic Foot Exam - Simple   No data filed      Lab Results  Component Value Date   WBC CANCELED 08/23/2023   HGB CANCELED 08/23/2023   HCT CANCELED 08/23/2023   PLT CANCELED 08/23/2023   GLUCOSE 145 (H) 08/23/2023   CHOL 193 08/23/2023   TRIG 320 (H) 08/23/2023   HDL 49 08/23/2023   LDLCALC 91 08/23/2023   ALT 20 08/23/2023   AST 23 08/23/2023   NA 143 08/23/2023   K 4.2 08/23/2023   CL 102 08/23/2023   CREATININE 0.93 08/23/2023   BUN 12 08/23/2023   CO2 25 08/23/2023   TSH 3.480 08/23/2023   HGBA1C 7.0 (  H) 08/23/2023      Assessment & Plan:  There are no diagnoses linked to this encounter.   No orders of the defined types were placed in this encounter.   No orders of the defined types were placed in this encounter.    Follow-up: No follow-ups on file.   I,Allante Whitmire,acting as a Neurosurgeon for Mercy Stall, MD.,have documented all relevant documentation on the behalf of Mercy Stall, MD,as directed by  Mercy Stall, MD while in the presence of Mercy Stall, MD.   An After Visit Summary was printed and given to the patient.  Mercy Stall, MD [facility] [phone]

## 2023-10-13 DIAGNOSIS — G4709 Other insomnia: Secondary | ICD-10-CM | POA: Diagnosis not present

## 2023-10-13 DIAGNOSIS — F330 Major depressive disorder, recurrent, mild: Secondary | ICD-10-CM | POA: Diagnosis not present

## 2023-10-13 DIAGNOSIS — R440 Auditory hallucinations: Secondary | ICD-10-CM | POA: Diagnosis not present

## 2023-10-28 DIAGNOSIS — N390 Urinary tract infection, site not specified: Secondary | ICD-10-CM | POA: Diagnosis not present

## 2023-10-31 ENCOUNTER — Telehealth: Payer: Self-pay | Admitting: Family Medicine

## 2023-10-31 NOTE — Telephone Encounter (Signed)
 [facility] Forms

## 2023-11-15 DIAGNOSIS — R440 Auditory hallucinations: Secondary | ICD-10-CM | POA: Diagnosis not present

## 2023-11-15 DIAGNOSIS — G4709 Other insomnia: Secondary | ICD-10-CM | POA: Diagnosis not present

## 2023-11-15 DIAGNOSIS — F330 Major depressive disorder, recurrent, mild: Secondary | ICD-10-CM | POA: Diagnosis not present

## 2023-11-23 ENCOUNTER — Ambulatory Visit: Admitting: Physician Assistant

## 2023-11-24 ENCOUNTER — Ambulatory Visit: Admitting: Neurology

## 2023-12-01 ENCOUNTER — Ambulatory Visit

## 2023-12-09 IMAGING — CT CT HEAD W/O CM
3 series · 14 of 47 positions shown, 16 images · non-contrast
Comparison: CT head 01/17/2020

CLINICAL DATA: Mental status change



[Series 2: head wo · axial · 0.46mm/px · z∈[-72,+58]mm · 8 of 32 slices shown, 10 images]
[im 3/32  brain]
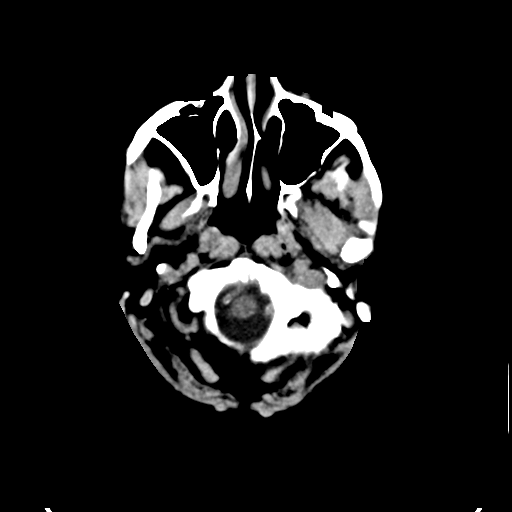
[im 3/32  bone]
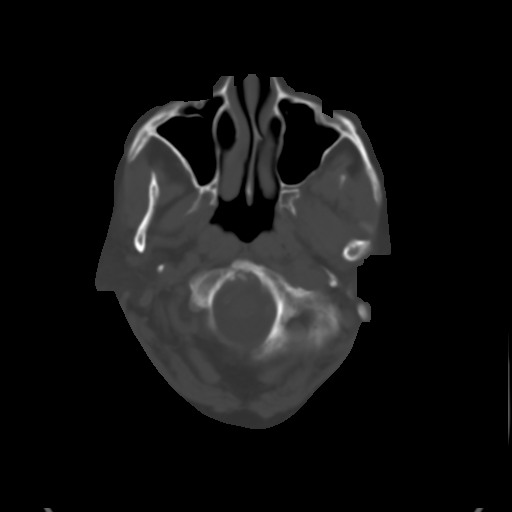
[im 7/32  brain]
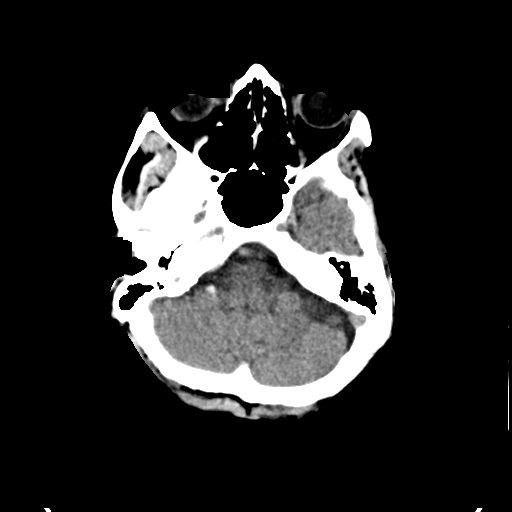
[im 10/32  brain]
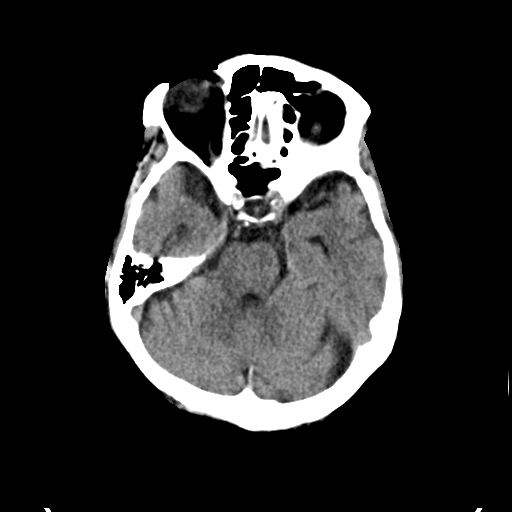
[im 14/32  brain]
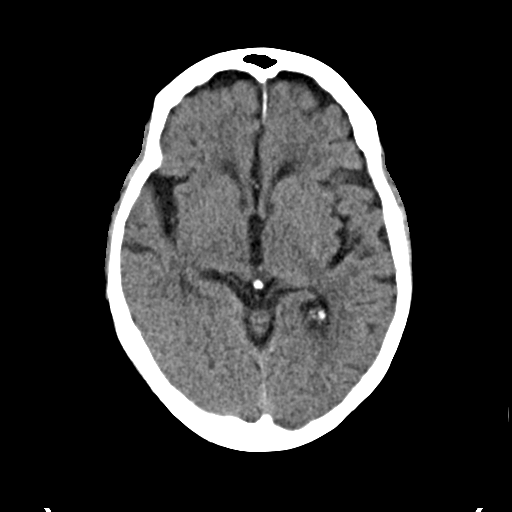
[im 18/32  brain]
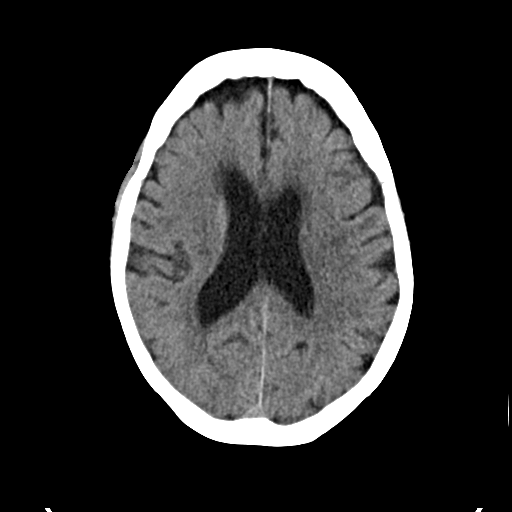
[im 18/32  bone]
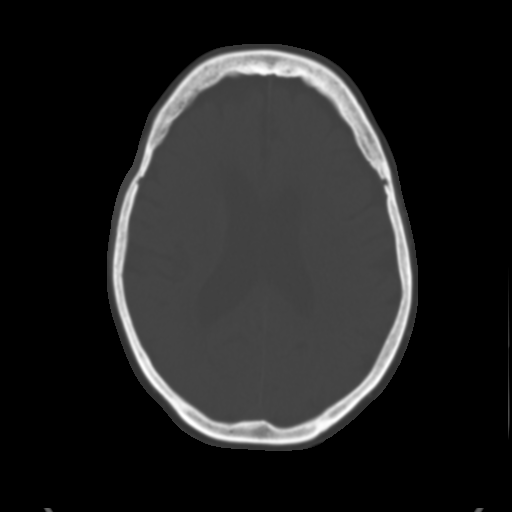
[im 22/32  brain]
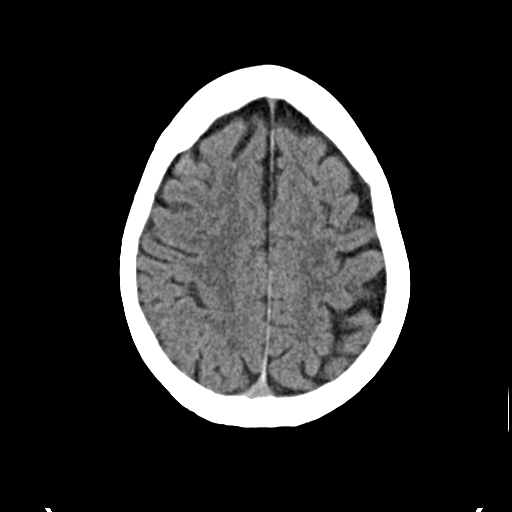
[im 25/32  brain]
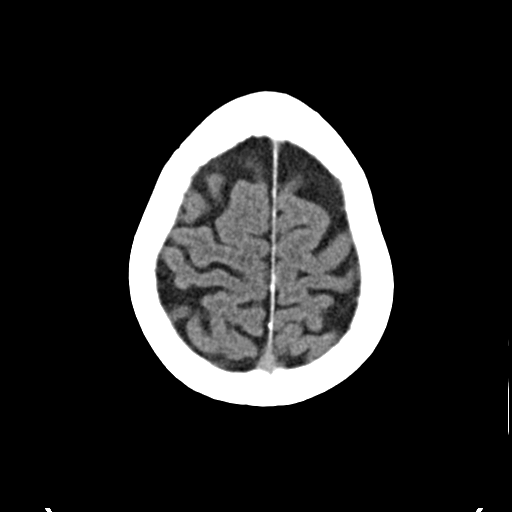
[im 29/32  brain]
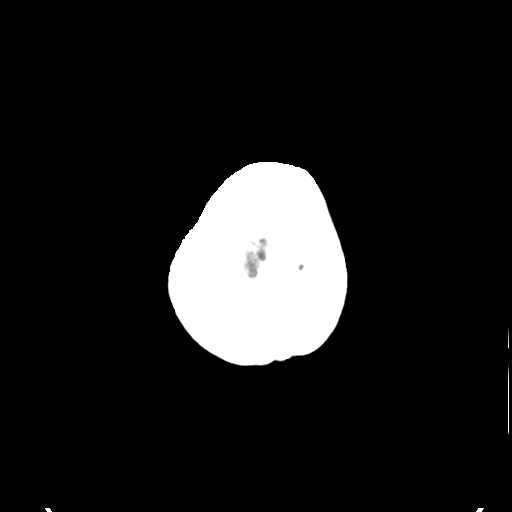

[Series 4: coronal soft · coronal · 0.30mm/px · 3 of 67 slices shown]
[im 23/67  brain]
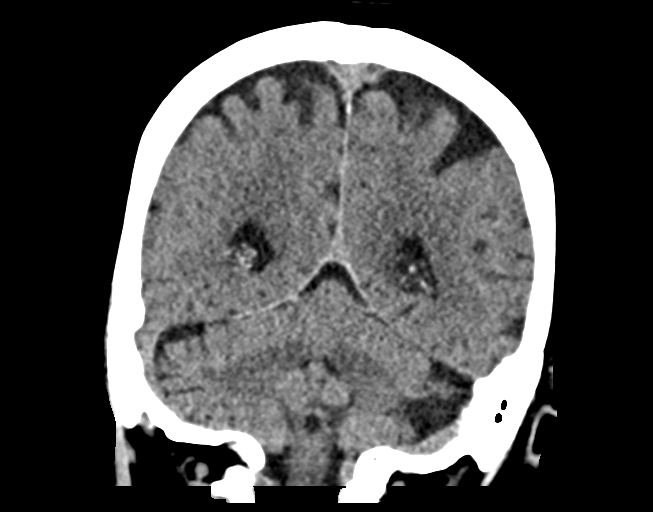
[im 30/67  brain]
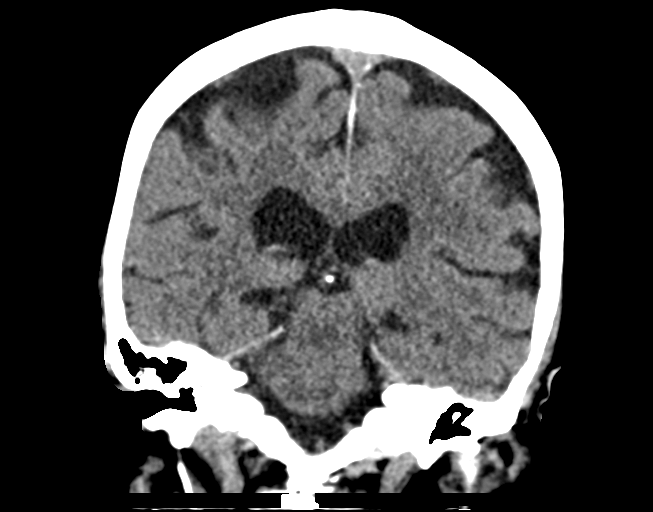
[im 37/67  brain]
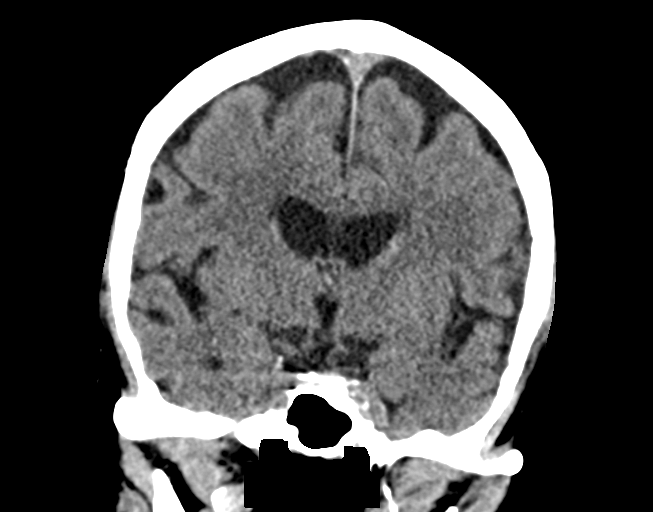

[Series 5: sag soft · sagittal · 0.30mm/px · 3 of 67 slices shown]
[im 23/67  brain]
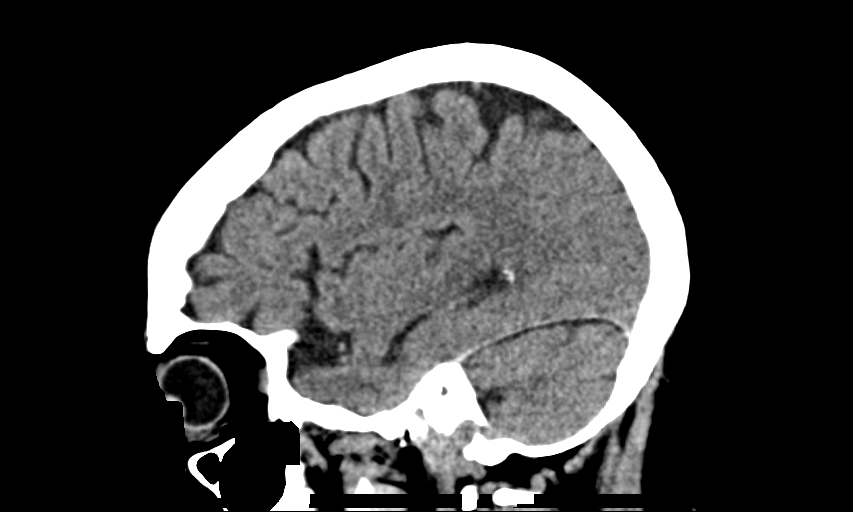
[im 34/67  brain]
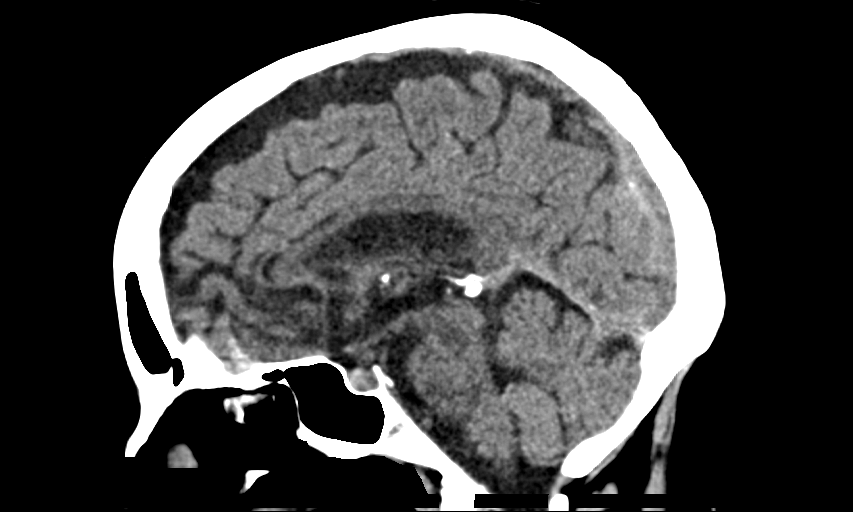
[im 45/67  brain]
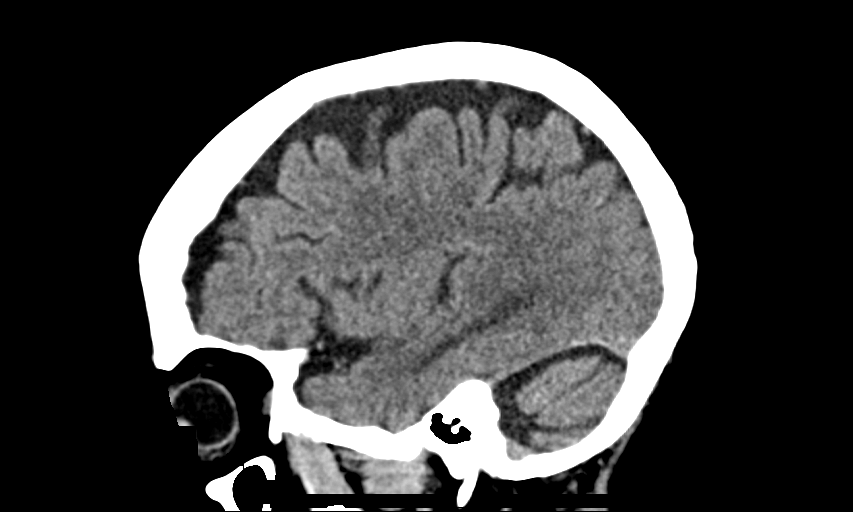

[14 of 47 positions shown; findings below may reference images not displayed]

FINDINGS: Brain: Mild cerebral atrophy, typical for age. Mild periventricular
white matter hypodensity bilaterally.

Negative for acute infarct, hemorrhage, mass.

Vascular: Negative for hyperdense vessel

Skull: Negative

Sinuses/Orbits: Mild mucosal edema maxillary sinus bilaterally.
Negative orbit

Other: None
IMPRESSION: No acute abnormality. Mild atrophy and mild chronic microvascular
ischemic change in the white matter.

## 2023-12-09 IMAGING — CT CT ABD-PELV W/ CM
2 of 5 series · 16 of 46 positions shown, 18 images · IV contrast (Omnipaque)
Comparison: CT abdomen pelvis 03/17/2012

CLINICAL DATA: Abdominal pain, acute, nonlocalized

EXAM:
CT ABDOMEN AND PELVIS WITH CONTRAST
TECHNIQUE: Multidetector CT imaging of the abdomen and pelvis was performed
using the standard protocol following bolus administration of
intravenous contrast.

[Series 2: axial st · axial · 0.92mm/px · z∈[-753,-383]mm · 13 of 84 slices shown, 15 images]
[im 5/84  soft-tissue]
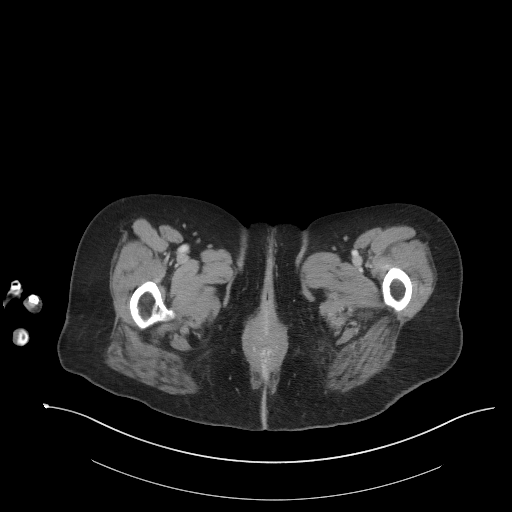
[im 5/84  bone]
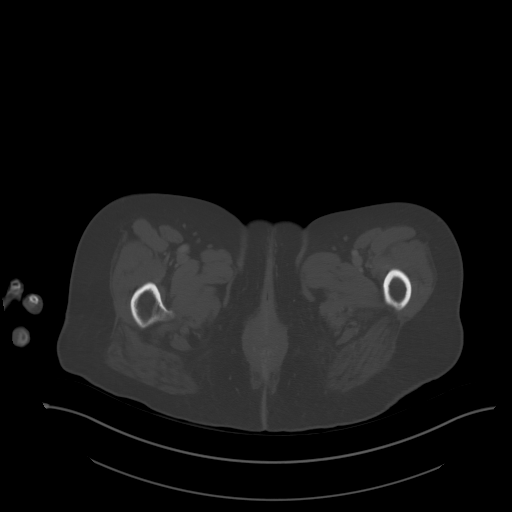
[im 13/84  soft-tissue]
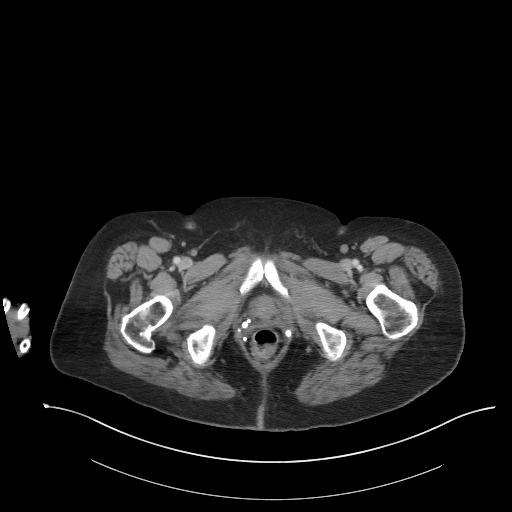
[im 17/84  soft-tissue]
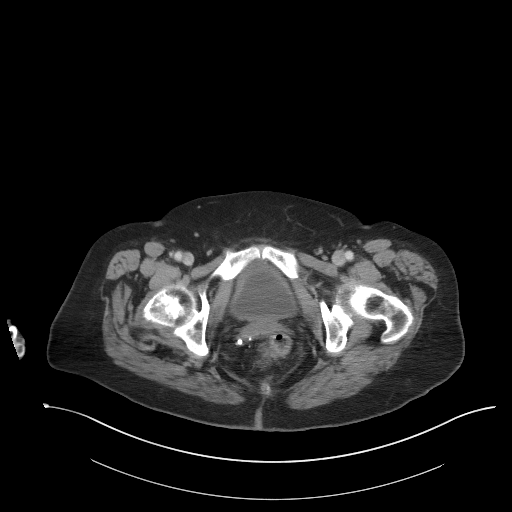
[im 25/84  soft-tissue]
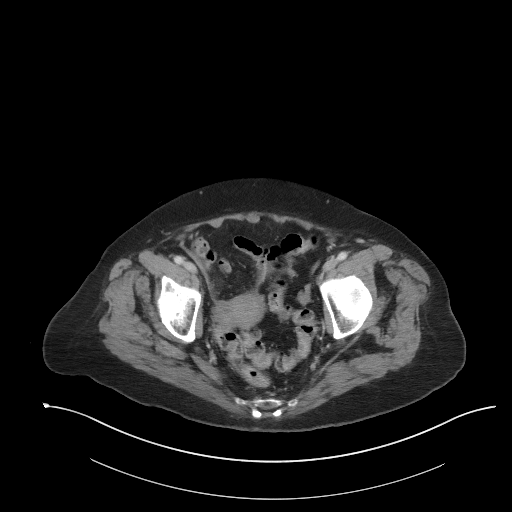
[im 30/84  soft-tissue]
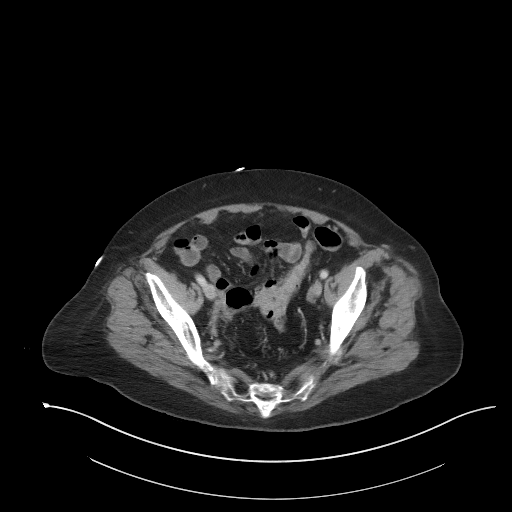
[im 38/84  soft-tissue]
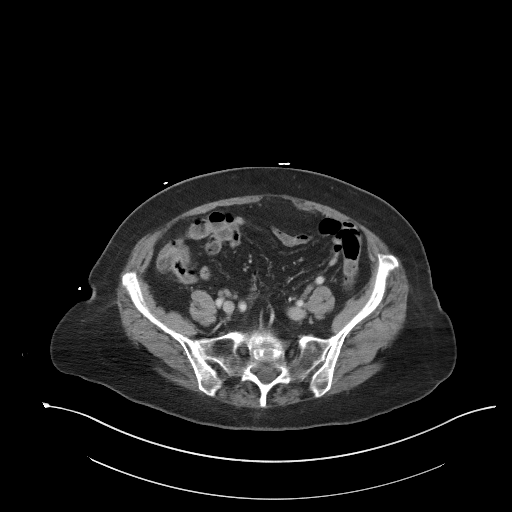
[im 42/84  soft-tissue]
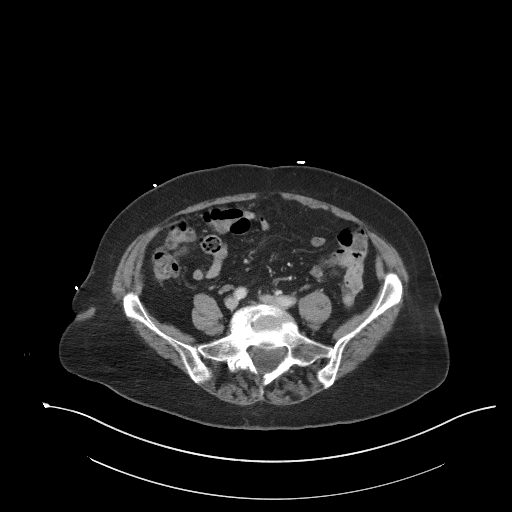
[im 46/84  soft-tissue]
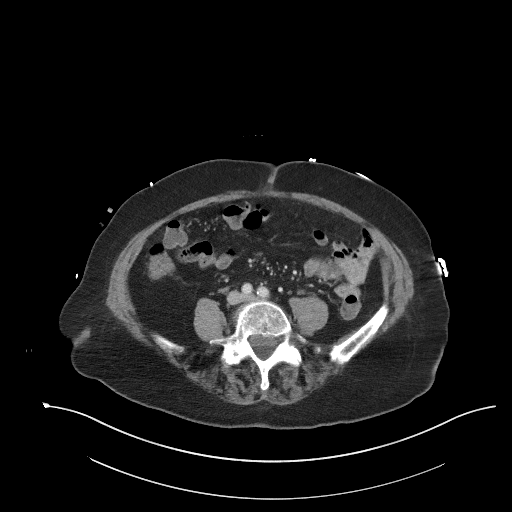
[im 54/84  soft-tissue]
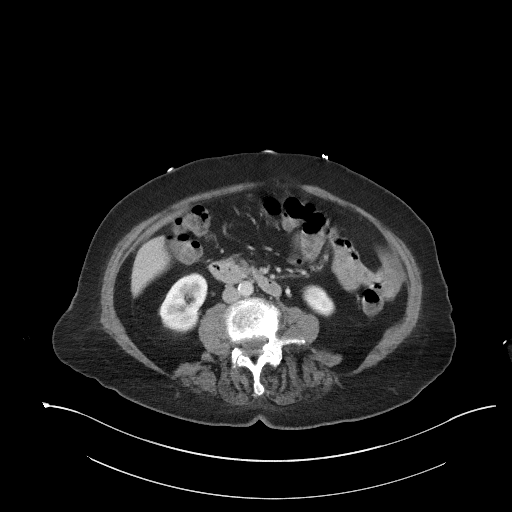
[im 54/84  bone]
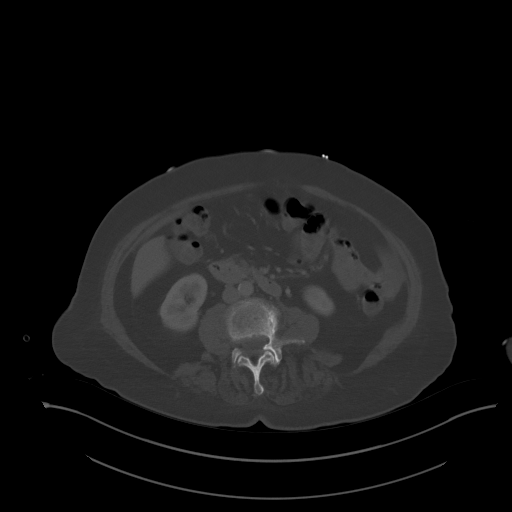
[im 59/84  soft-tissue]
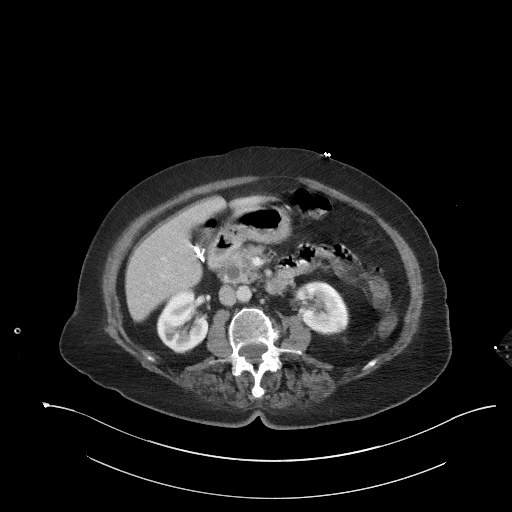
[im 67/84  soft-tissue]
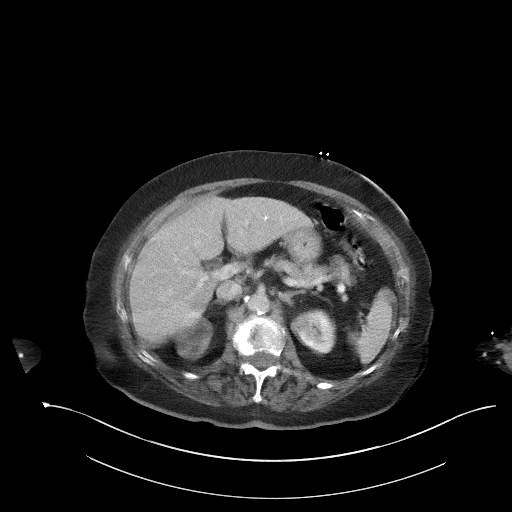
[im 71/84  soft-tissue]
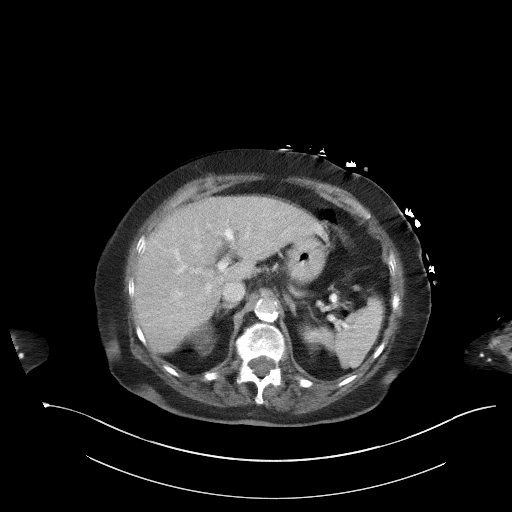
[im 79/84  soft-tissue]
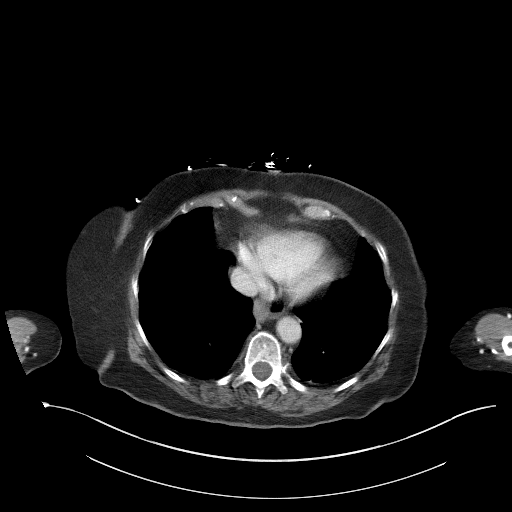

[Series 5: coronal st · coronal · 0.77mm/px · 3 of 101 slices shown]
[im 34/101  soft-tissue]
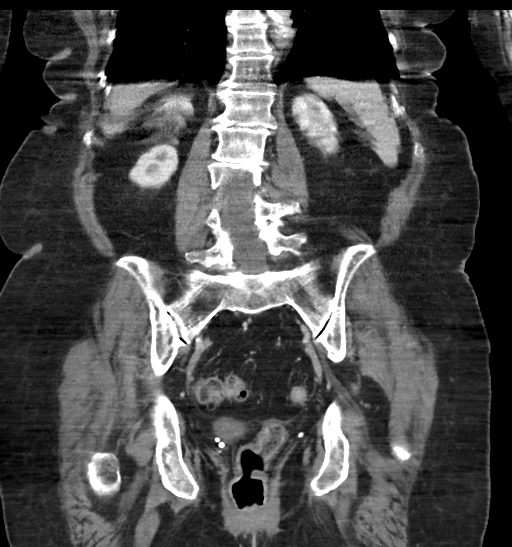
[im 45/101  soft-tissue]
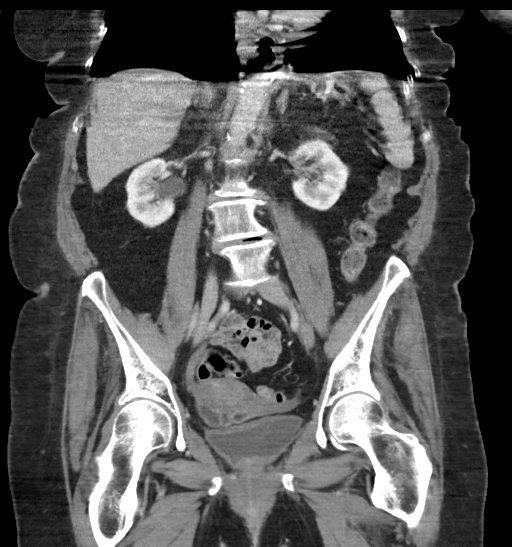
[im 56/101  soft-tissue]
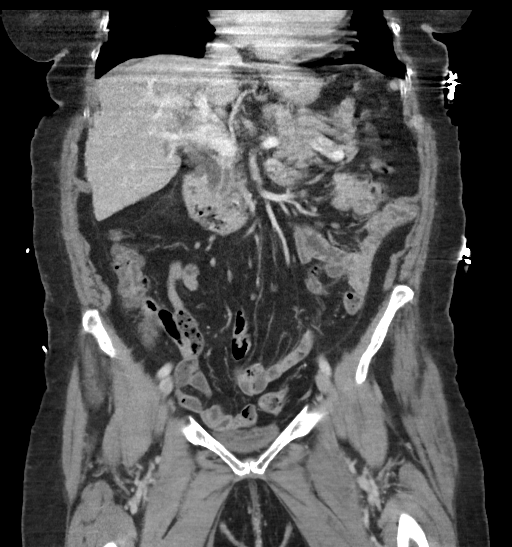

[16 of 46 positions shown; findings below may reference images not displayed]

RADIATION DOSE REDUCTION: This exam was performed according to the
departmental dose-optimization program which includes automated
exposure control, adjustment of the mA and/or kV according to
patient size and/or use of iterative reconstruction technique.

CONTRAST:  100mL OMNIPAQUE IOHEXOL 300 MG/ML  SOLN
FINDINGS: Lower chest: No acute abnormality. Linear atelectasis versus
scarring at the left base. Small hiatal hernia.

Hepatobiliary: No focal liver abnormality. Status post
cholecystectomy. No intrahepatic biliary dilatation. Enlarged common
bile duct which can be seen in the post cholecystectomy setting.

Pancreas: No focal lesion. Normal pancreatic contour. No surrounding
inflammatory changes. No main pancreatic ductal dilatation.

Spleen: Normal in size without focal abnormality.  Splenule noted.

Adrenals/Urinary Tract:

No adrenal nodule bilaterally.

Bilateral kidneys enhance symmetrically. Subcentimeter hypodensities
too small to characterize.

No hydronephrosis. No hydroureter.

The urinary bladder is unremarkable.

Stomach/Bowel: Stomach is within normal limits. No evidence of bowel
wall thickening or dilatation. The appendix is not definitely
identified with no inflammatory changes in the right lower quadrant
to suggest acute appendicitis.

Vascular/Lymphatic: No abdominal aorta or iliac aneurysm. Moderate
to severe atherosclerotic plaque of the aorta and its branches. No
abdominal, pelvic, or inguinal lymphadenopathy.

Reproductive: Uterus and bilateral adnexa are unremarkable.

Other: No intraperitoneal free fluid. No intraperitoneal free gas.
No organized fluid collection.

Musculoskeletal:

No abdominal wall hernia or abnormality.

No suspicious lytic or blastic osseous lesions. No acute displaced
fracture. Multilevel degenerative changes of the spine.
IMPRESSION: 1. Small hiatal hernia.
2. No acute intra-abdominal or intrapelvic abnormality.
3.  Aortic Atherosclerosis ([id number]).

## 2024-01-02 DIAGNOSIS — F331 Major depressive disorder, recurrent, moderate: Secondary | ICD-10-CM | POA: Diagnosis not present

## 2024-01-06 DIAGNOSIS — N390 Urinary tract infection, site not specified: Secondary | ICD-10-CM | POA: Diagnosis not present

## 2024-01-06 NOTE — Progress Notes (Signed)
   01/06/2024  Patient ID: Michele Parker Molly, female   DOB: 04-20-1941, 83 y.o.   MRN: [id number]  Pharmacy Quality Measure Review  This patient is appearing on a report for being at risk of failing the adherence measure for cholesterol (statin) and hypertension (ACEi/ARB) medications this calendar year.   atorvastatin stopped - disease complexity, delayed reported with [facility] for losartan ; no intervention at this time  Lang Sieve, PharmD, BCGP Clinical Pharmacist  [phone]

## 2024-01-09 ENCOUNTER — Telehealth: Payer: Self-pay | Admitting: Family Medicine

## 2024-01-09 NOTE — Telephone Encounter (Signed)
 [facility]

## 2024-01-11 ENCOUNTER — Ambulatory Visit: Admitting: Family Medicine

## 2024-01-11 DIAGNOSIS — N390 Urinary tract infection, site not specified: Secondary | ICD-10-CM | POA: Diagnosis not present

## 2024-05-03 NOTE — Telephone Encounter (Signed)
 This encounter was created in error - please disregard.

## 2024-05-31 DEATH — deceased
